# Patient Record
Sex: Female | Born: 1995 | Race: White | Marital: Single
Health system: Southern US, Community
[De-identification: ages and names within clinical notes are randomized; demographics above are authoritative.]

## PROBLEM LIST (undated history)

## (undated) DIAGNOSIS — K219 Gastro-esophageal reflux disease without esophagitis: Secondary | ICD-10-CM

## (undated) DIAGNOSIS — M419 Scoliosis, unspecified: Secondary | ICD-10-CM

## (undated) HISTORY — DX: Scoliosis, unspecified: M41.9

## (undated) HISTORY — DX: Gastro-esophageal reflux disease without esophagitis: K21.9

## (undated) HISTORY — PX: RHINOPLASTY: SUR1284

---
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
## 2014-05-30 ENCOUNTER — Ambulatory Visit (HOSPITAL_BASED_OUTPATIENT_CLINIC_OR_DEPARTMENT_OTHER)
Admission: RE | Admit: 2014-05-30 | Discharge: 2014-05-30 | Disposition: A | Discharge status: Home / Self Care | Payer: BC Managed Care – PPO | Source: Ambulatory Visit | Attending: Family Medicine | Admitting: Family Medicine

## 2014-05-30 ENCOUNTER — Other Ambulatory Visit: Payer: BC Managed Care – PPO

## 2014-05-30 ENCOUNTER — Encounter: Payer: Self-pay | Admitting: Family Medicine

## 2014-05-30 ENCOUNTER — Ambulatory Visit (INDEPENDENT_AMBULATORY_CARE_PROVIDER_SITE_OTHER): Payer: BC Managed Care – PPO | Admitting: Family Medicine

## 2014-05-30 VITALS — BP 106/70 | HR 68 | Resp 16 | Ht 64.5 in | Wt 114.5 lb

## 2014-05-30 DIAGNOSIS — R109 Unspecified abdominal pain: Secondary | ICD-10-CM | POA: Diagnosis present

## 2014-05-30 DIAGNOSIS — K8050 Calculus of bile duct without cholangitis or cholecystitis without obstruction: Secondary | ICD-10-CM | POA: Insufficient documentation

## 2014-05-30 DIAGNOSIS — R112 Nausea with vomiting, unspecified: Secondary | ICD-10-CM | POA: Insufficient documentation

## 2014-05-30 DIAGNOSIS — K8020 Calculus of gallbladder without cholecystitis without obstruction: Secondary | ICD-10-CM

## 2014-05-30 LAB — CBC WITH DIFFERENTIAL/PLATELET
Basophils Absolute: 0 10*3/uL (ref 0.0–0.1)
Basophils Relative: 0.5 % (ref 0.0–3.0)
Eosinophils Absolute: 0.1 10*3/uL (ref 0.0–0.7)
Eosinophils Relative: 2.1 % (ref 0.0–5.0)
HEMATOCRIT: 42 % (ref 36.0–49.0)
Hemoglobin: 14.2 g/dL (ref 12.0–16.0)
Lymphocytes Relative: 22.3 % — ABNORMAL LOW (ref 24.0–48.0)
Lymphs Abs: 1.4 10*3/uL (ref 0.7–4.0)
MCHC: 33.9 g/dL (ref 31.0–37.0)
MCV: 91.8 fl (ref 78.0–98.0)
MONO ABS: 0.5 10*3/uL (ref 0.1–1.0)
Monocytes Relative: 8.3 % (ref 3.0–12.0)
Neutro Abs: 4.2 10*3/uL (ref 1.4–7.7)
Neutrophils Relative %: 66.8 % (ref 43.0–71.0)
PLATELETS: 181 10*3/uL (ref 150.0–575.0)
RBC: 4.57 Mil/uL (ref 3.80–5.70)
RDW: 12.4 % (ref 11.4–15.5)
WBC: 6.2 10*3/uL (ref 4.5–13.5)

## 2014-05-30 LAB — AMYLASE: Amylase: 68 U/L (ref 27–131)

## 2014-05-30 LAB — BASIC METABOLIC PANEL
BUN: 12 mg/dL (ref 6–23)
CALCIUM: 9.8 mg/dL (ref 8.4–10.5)
CO2: 29 mEq/L (ref 19–32)
Chloride: 101 mEq/L (ref 96–112)
Creatinine, Ser: 0.8 mg/dL (ref 0.4–1.2)
GFR: 96.18 mL/min (ref >60.00)
Glucose, Bld: 80 mg/dL (ref 70–99)
Potassium: 3.8 mEq/L (ref 3.5–5.1)
SODIUM: 136 meq/L (ref 135–145)

## 2014-05-30 LAB — HEPATIC FUNCTION PANEL
ALBUMIN: 4.1 g/dL (ref 3.5–5.2)
ALK PHOS: 64 U/L (ref 47–119)
ALT: 21 U/L (ref 0–35)
AST: 27 U/L (ref 0–37)
Bilirubin, Direct: 0 mg/dL (ref 0.0–0.3)
Total Bilirubin: 0.6 mg/dL (ref 0.2–1.2)
Total Protein: 6.9 g/dL (ref 6.0–8.3)

## 2014-05-30 LAB — LIPID PANEL
Cholesterol: 109 mg/dL (ref 0–200)
HDL: 43.3 mg/dL (ref >39.00)
LDL Cholesterol: 56 mg/dL (ref 0–99)
NONHDL: 65.7
TRIGLYCERIDES: 48 mg/dL (ref 0.0–149.0)
Total CHOL/HDL Ratio: 3
VLDL: 9.6 mg/dL (ref 0.0–40.0)

## 2014-05-30 LAB — LIPASE: Lipase: 25 U/L (ref 11.0–59.0)

## 2014-05-30 NOTE — Progress Notes (Signed)
   Subjective:    Patient ID: Brianna Hampton, female    DOB: 1995-12-12, 18 y.o.   MRN: [id number]  HPI New to establish.  [facility]  Abd pain- pt reports 3 separate occurences where she will have 'doubled over pain', it will last ~8 hrs, she will vomit 'continuously'.  No diarrhea.  sxs resolve spontaneously.  No fevers.  Pt denies excessive ETOH, fatty foods.  [place]  Did not have imaging or lab work done.  Last episode was June 10th.  No pain inbetween episodes, able to eat regularly.  Denies GERD.  abd pain is epigastric.  Pain will radiate to R shoulder when it occurs.  [facility].   Review of Systems For ROS see HPI     Objective:   Physical Exam  Vitals reviewed. Constitutional: She is oriented to person, place, and time. She appears well-developed and well-nourished. No distress.  HENT:  Head: Normocephalic and atraumatic.  Eyes: Conjunctivae and EOM are normal. Pupils are equal, round, and reactive to light.  Neck: Normal range of motion. Neck supple. No thyromegaly present.  Cardiovascular: Normal rate, regular rhythm, normal heart sounds and intact distal pulses.   No murmur heard. Pulmonary/Chest: Effort normal and breath sounds normal. No respiratory distress.  Abdominal: Soft. Bowel sounds are normal. She exhibits no distension. There is no tenderness. There is no rebound and no guarding.  Musculoskeletal: She exhibits no edema.  Lymphadenopathy:    She has no cervical adenopathy.  Neurological: She is alert and oriented to person, place, and time.  Skin: Skin is warm and dry.  Psychiatric: She has a normal mood and affect. Her behavior is normal.          Assessment & Plan:

## 2014-05-30 NOTE — Progress Notes (Signed)
Pre visit review using our clinic review tool, if applicable. No additional management support is needed unless otherwise documented below in the visit note. 

## 2014-05-30 NOTE — Assessment & Plan Note (Signed)
New.  Pt has had 3 episodes of severe abdominal pain radiating to R shoulder w/ 'continuous vomiting'.  Pt mentioned that before the last episode, she had drank a milkshake.  Will get labs and US to assess.  Reviewed need for pt to eat low fat diet.  Will follow.

## 2014-05-30 NOTE — Patient Instructions (Signed)
Follow up as needed We'll notify you of your lab results and make any changes if needed Your US will be at 2:00 this afternoon at [facility] [address] Please watch high fat intake- this can trigger your abdominal symptoms Call with any questions or concerns Hang in there!!!

## 2014-06-02 ENCOUNTER — Encounter: Payer: Self-pay | Admitting: General Practice

## 2014-10-21 ENCOUNTER — Encounter: Payer: Self-pay | Admitting: Medical

## 2014-10-21 ENCOUNTER — Ambulatory Visit (INDEPENDENT_AMBULATORY_CARE_PROVIDER_SITE_OTHER): Payer: 59 | Admitting: Medical

## 2014-10-21 VITALS — BP 115/75 | HR 78 | Temp 97.2°F | Ht 64.5 in | Wt 114.2 lb

## 2014-10-21 DIAGNOSIS — J069 Acute upper respiratory infection, unspecified: Secondary | ICD-10-CM

## 2014-10-21 DIAGNOSIS — H109 Unspecified conjunctivitis: Secondary | ICD-10-CM

## 2014-10-21 MED ORDER — TOBRAMYCIN 0.3 % OP SOLN: 2.0000 [drp] | Freq: Four times a day (QID) | OPHTHALMIC | Status: DC

## 2014-10-21 MED ORDER — BENZONATATE 100 MG PO CAPS: 100.0000 mg | ORAL_CAPSULE | Freq: Three times a day (TID) | ORAL | Status: DC | PRN

## 2014-10-21 MED ORDER — FLUTICASONE PROPIONATE 50 MCG/ACT NA SUSP: 2.0000 | Freq: Every day | NASAL | Status: DC

## 2014-10-21 NOTE — Assessment & Plan Note (Signed)
Your appear to have bacterial conjunctivitis. I am prescribing tobrex eye drops. Any worsening or changing eye symptoms notify us.

## 2014-10-21 NOTE — Progress Notes (Signed)
Subjective:    Patient ID: Brianna Hampton, female    DOB: 09-26-1996, 19 y.o.   MRN: [id number]  HPI   Pt has rt  eye irritation for  3-4 days. No eye trauma or foreign body injury. Eye is mild irritated with redness. Slight matting daily in am x 3 days. Pt is not sure if exposure to person with conjunctivitis.(She lives in a dorm and may have had exposure).  No blurred vision. No rash or blister outbreak around eyes reported.  This am little crustiness to her left eye.   Since new years mild congestion, pnd, and runny nose with cough.  LMP- thanksgiving. Hx of irregular. No on ocp. Pt not sexually active.   Past Medical History  Diagnosis Date  . Scoliosis     History   Social History  . Marital Status: Single    Spouse Name: N/A    Number of Children: N/A  . Years of Education: N/A   Occupational History  . Not on file.   Social History Main Topics  . Smoking status: Never Smoker   . Smokeless tobacco: Not on file  . Alcohol Use: Yes  . Drug Use: No  . Sexual Activity: No   Other Topics Concern  . Not on file   Social History Narrative    Past Surgical History  Procedure Laterality Date  . Rhinoplasty      Family History  Problem Relation Age of Onset  . Cancer Paternal Aunt     breast and lung  . Arthritis Maternal Grandmother   . Heart disease Maternal Grandmother   . Arthritis Maternal Grandfather   . Arthritis Paternal Grandmother   . Arthritis Paternal Grandfather     No Known Allergies  No current outpatient prescriptions on file prior to visit.   No current facility-administered medications on file prior to visit.    BP 115/75 mmHg  Pulse 78  Temp(Src) 97.2 F (36.2 C) (Oral)  Ht 5' 4.5" (1.638 m)  Wt 114 lb 3.2 oz (51.801 kg)  BMI 19.31 kg/m2  SpO2 99%  LMP 09/11/2014      Review of Systems  Constitutional: Negative for fever, chills and fatigue.  HENT: Positive for congestion and postnasal drip. Negative for ear pain,  rhinorrhea, sinus pressure and sore throat.   Eyes:       Mild irritation redness and matting to both eyes.  Respiratory: Positive for cough.        Minimal transient occasnional recently. Since new years eve.  Cardiovascular: Negative for chest pain and palpitations.  Musculoskeletal: Negative for back pain.  Neurological: Negative for dizziness, weakness and headaches.  Hematological: Negative for adenopathy. Does not bruise/bleed easily.       Objective:   Physical Exam   General  Mental Status - Alert. General Appearance - Well groomed. Not in acute distress.  Skin Rashes- No Rashes.  HEENT Head- Normal. Ear Auditory Canal - Left- Normal. Right - Normal.Tympanic Membrane- Left- Normal. Right- Normal. Eye Sclera/Conjunctiva- Left- Normal presently. Right- faint red conjuctiva. Mild dry matting in rt lower eyelash Nose & Sinuses Nasal Mucosa- Left-  Mild boggy+  Congested. Right-  Mild  boggy + Congested. No sinus pressure. Mouth & Throat Lips: Upper Lip- Normal: no dryness, cracking, pallor, cyanosis, or vesicular eruption. Lower Lip-Normal: no dryness, cracking, pallor, cyanosis or vesicular eruption. Buccal Mucosa- Bilateral- No Aphthous ulcers. Oropharynx- No Discharge or Erythema. Tonsils: Characteristics- Bilateral- No Erythema or Congestion. Size/Enlargement- Bilateral- No enlargement.  Discharge- bilateral-None.  Neck Neck- Supple. No Masses.   Chest and Lung Exam Auscultation: Breath Sounds:- even and unlabored,   Cardiovascular Auscultation:Rythm- Regular, rate and rhythm. Murmurs & Other Heart Sounds:Ausculatation of the heart reveal- No Murmurs.  Lymphatic Head & Neck General Head & Neck Lymphatics: Bilateral: Description- No Localized lymphadenopathy.         Assessment & Plan:

## 2014-10-21 NOTE — Assessment & Plan Note (Signed)
For your recent uri type symptoms, I am prescribing flonase for the nasal congestion and benzonatate for cough. If your symptoms change or worsen notify us

## 2014-10-21 NOTE — Patient Instructions (Addendum)
Your appear to have bacterial conjunctivitis. I am prescribing tobrex eye drops. Any worsening or changing eye symptoms notify us.  For your recent uri type symptoms, I am prescribing flonase for the nasal congestion and benzonatate for cough. If your symptoms change or worsen notify us.  Follow up in 7 days or as needed.

## 2014-10-21 NOTE — Progress Notes (Signed)
Pre visit review using our clinic review tool, if applicable. No additional management support is needed unless otherwise documented below in the visit note. 

## 2015-01-03 IMAGING — US US ABDOMEN COMPLETE
1 series · 14 of 25 positions shown · non-contrast
Comparison: None.

CLINICAL DATA: Intermittent abdominal pain for 9 months with nausea
and vomiting.

EXAM:
ULTRASOUND ABDOMEN COMPLETE

[Series 1: us abdomen complete · 0.22mm/px · 14 of 85 slices shown]
[im 1/85]
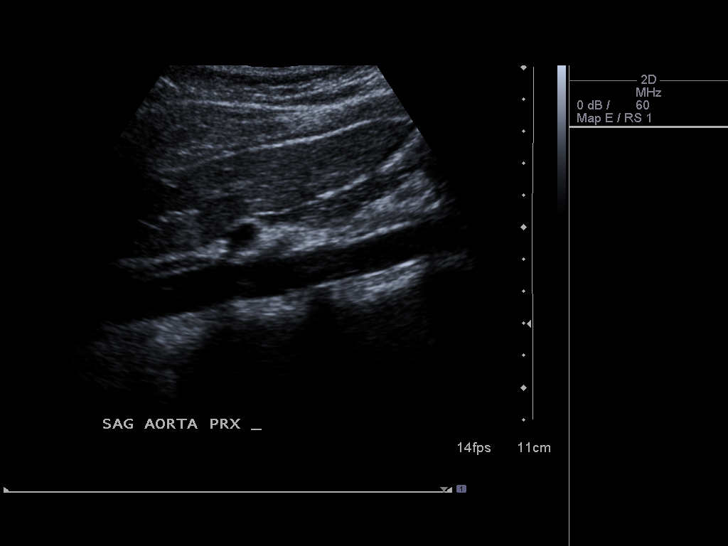
[im 8/85]
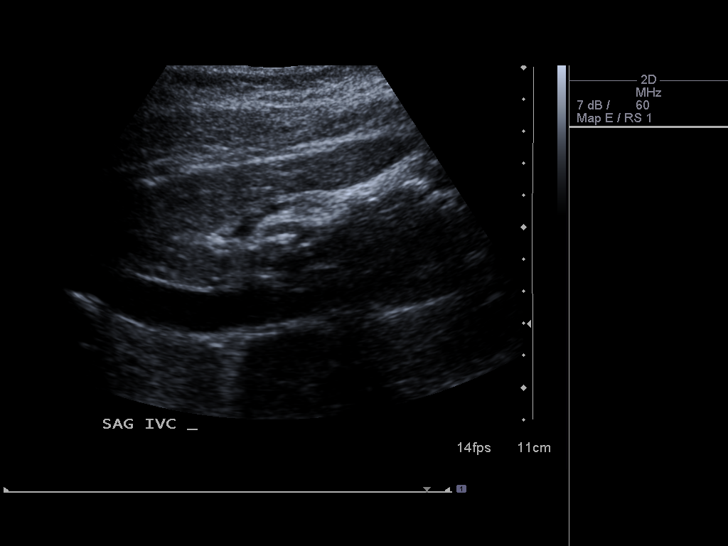
[im 15/85]
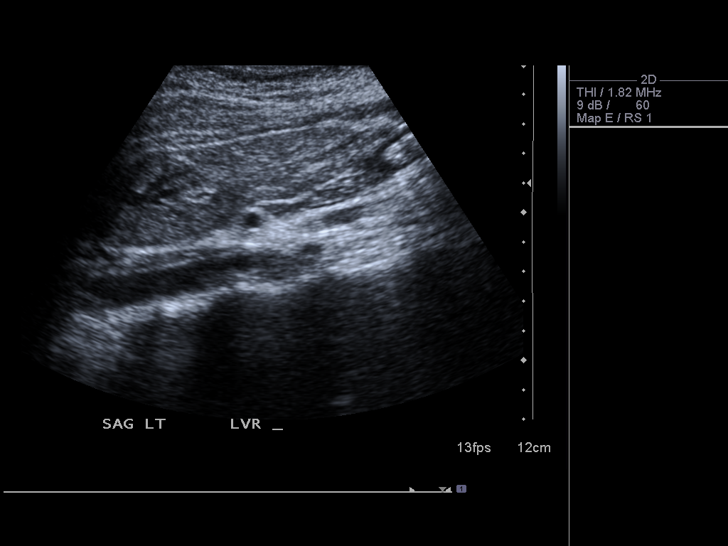
[im 22/85]
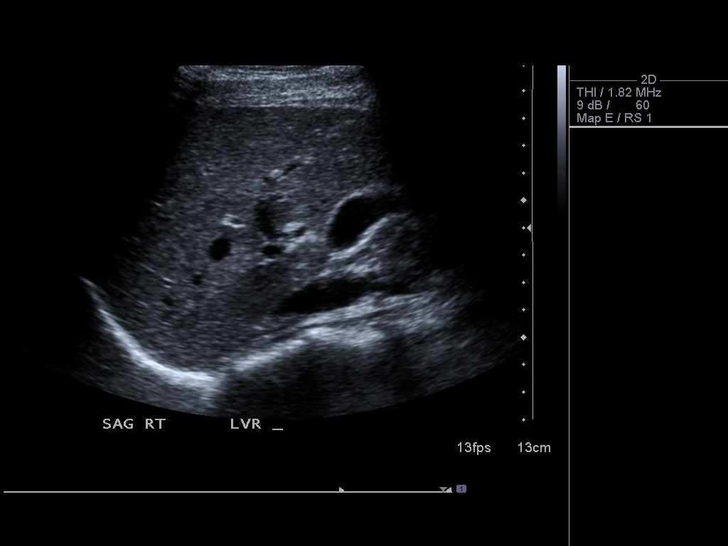
[im 29/85]
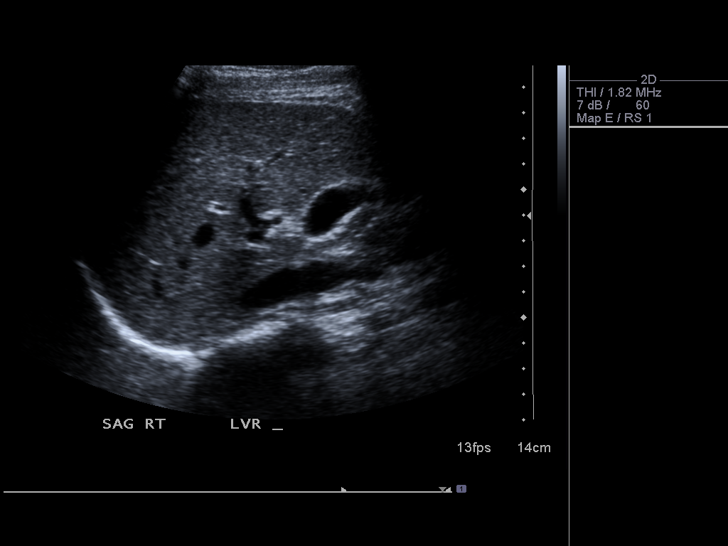
[im 32/85]
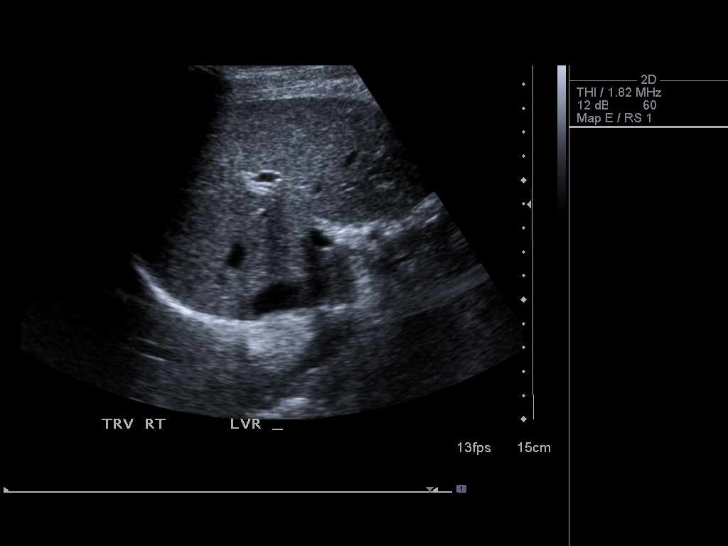
[im 39/85]
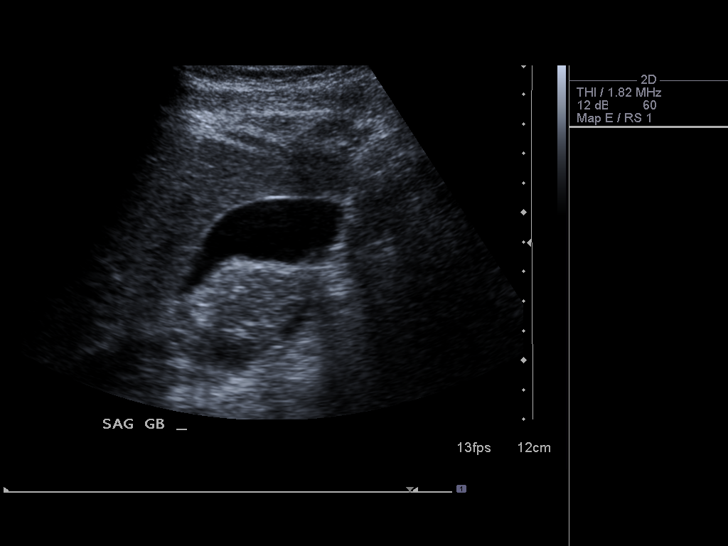
[im 46/85]
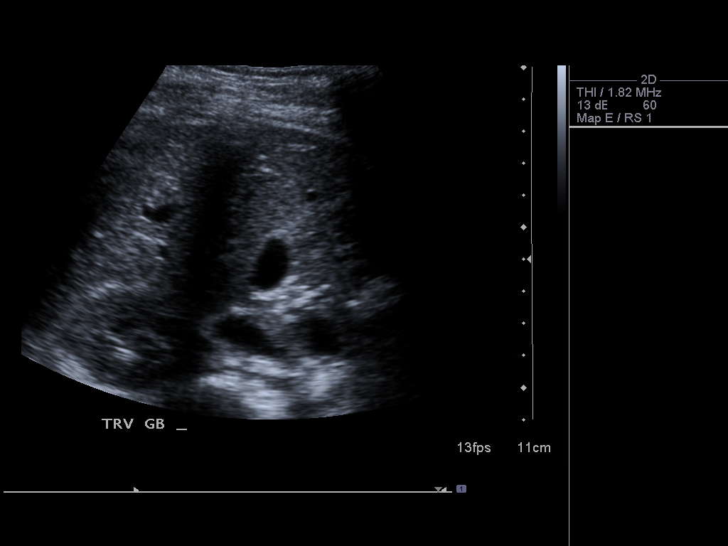
[im 53/85]
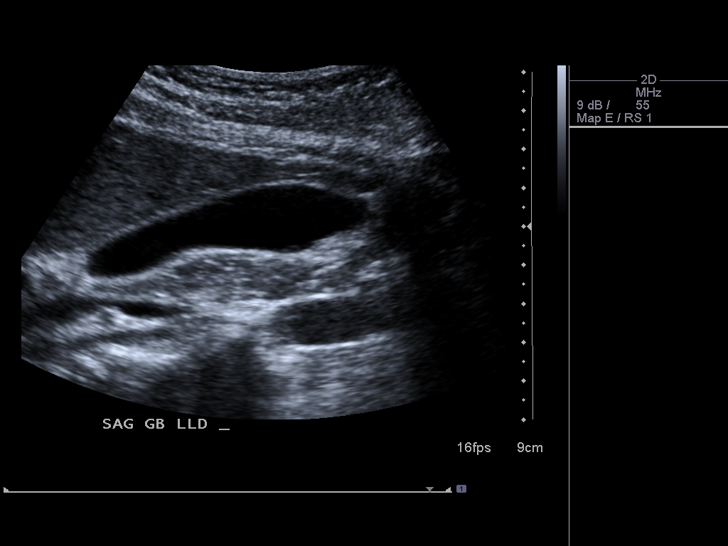
[im 57/85]
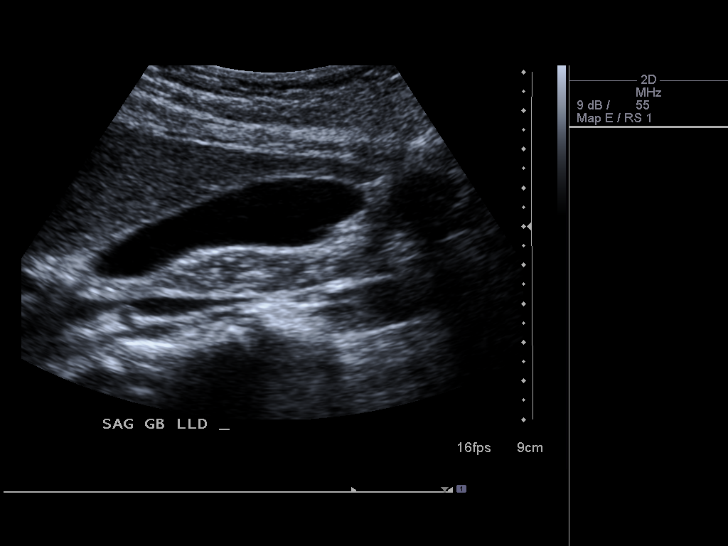
[im 64/85]
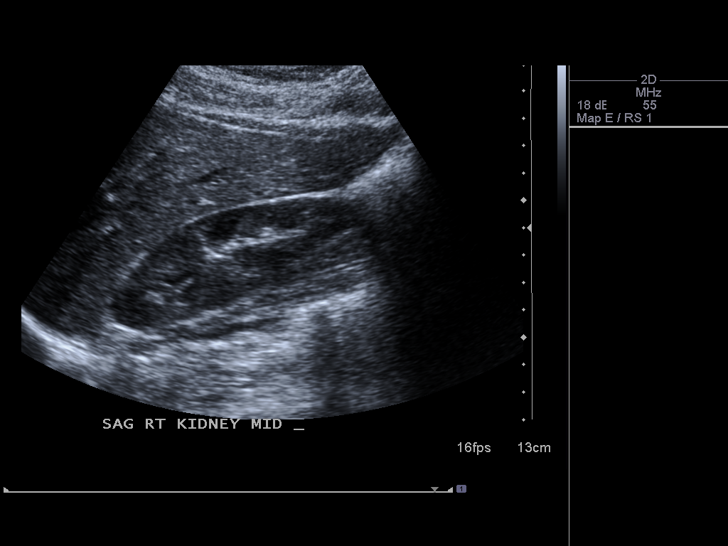
[im 71/85]
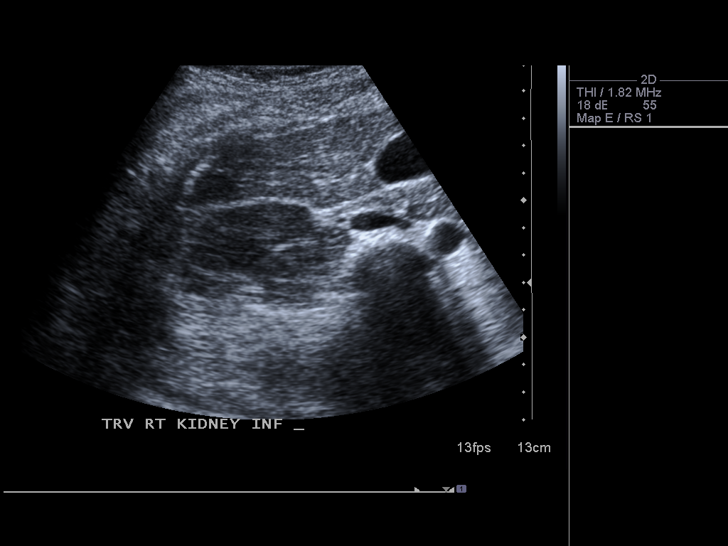
[im 78/85]
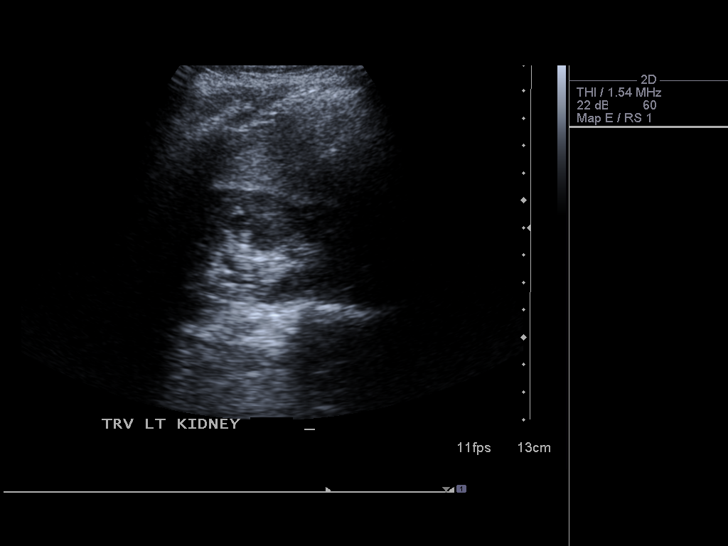
[im 85/85]
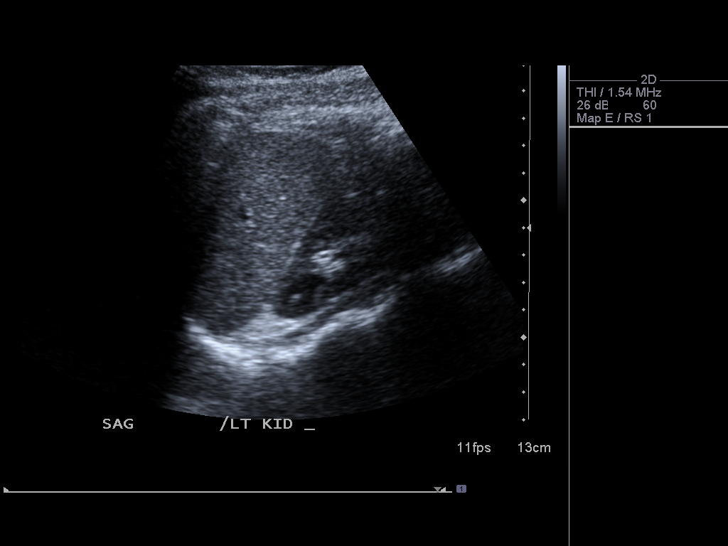

[14 of 25 positions shown; findings below may reference images not displayed]

FINDINGS: Gallbladder:

No gallstones or wall thickening visualized. No sonographic Murphy
sign noted.

Common bile duct:

Diameter: 0.9 cm

Liver:

No focal lesion identified. Within normal limits in parenchymal
echogenicity.

IVC:

No abnormality visualized.

Pancreas:

Visualized portion unremarkable.

Spleen:

Size and appearance within normal limits.

Right Kidney:

Length: 10.5 cm. Echogenicity within normal limits. No mass or
hydronephrosis visualized.

Left Kidney:

Length: 10.5 cm. Echogenicity within normal limits. No mass or
hydronephrosis visualized.

Abdominal aorta:

No aneurysm visualized.

Other findings:

None.
IMPRESSION: Negative for gallstones.  Normal examination.

## 2015-03-19 ENCOUNTER — Telehealth: Payer: Self-pay | Admitting: *Deleted

## 2015-03-19 ENCOUNTER — Encounter: Payer: Self-pay | Admitting: *Deleted

## 2015-03-19 NOTE — Telephone Encounter (Signed)
Pre-Visit Call completed with patient and chart updated.   Pre-Visit Info documented in Specialty Comments under [employer].    

## 2015-03-23 ENCOUNTER — Ambulatory Visit (INDEPENDENT_AMBULATORY_CARE_PROVIDER_SITE_OTHER): Payer: 59 | Admitting: Physician Assistant

## 2015-03-23 ENCOUNTER — Encounter: Payer: Self-pay | Admitting: Physician Assistant

## 2015-03-23 VITALS — BP 94/56 | HR 47 | Temp 97.6°F | Ht 64.5 in | Wt 108.0 lb

## 2015-03-23 DIAGNOSIS — Z114 Encounter for screening for human immunodeficiency virus [HIV]: Secondary | ICD-10-CM | POA: Diagnosis not present

## 2015-03-23 DIAGNOSIS — Z0000 Encounter for general adult medical examination without abnormal findings: Secondary | ICD-10-CM

## 2015-03-23 DIAGNOSIS — R21 Rash and other nonspecific skin eruption: Secondary | ICD-10-CM | POA: Insufficient documentation

## 2015-03-23 LAB — LIPID PANEL
Cholesterol: 133 mg/dL (ref 0–200)
HDL: 46.6 mg/dL (ref >39.00)
LDL Cholesterol: 75 mg/dL (ref 0–99)
NonHDL: 86.4
TRIGLYCERIDES: 59 mg/dL (ref 0.0–149.0)
Total CHOL/HDL Ratio: 3
VLDL: 11.8 mg/dL (ref 0.0–40.0)

## 2015-03-23 LAB — HEPATIC FUNCTION PANEL
ALK PHOS: 88 U/L (ref 39–117)
ALT: 17 U/L (ref 0–35)
AST: 27 U/L (ref 0–37)
Albumin: 4.4 g/dL (ref 3.5–5.2)
BILIRUBIN DIRECT: 0.1 mg/dL (ref 0.0–0.3)
Total Bilirubin: 0.4 mg/dL (ref 0.2–1.2)
Total Protein: 7.5 g/dL (ref 6.0–8.3)

## 2015-03-23 LAB — BASIC METABOLIC PANEL
BUN: 13 mg/dL (ref 6–23)
CALCIUM: 9.7 mg/dL (ref 8.4–10.5)
CHLORIDE: 104 meq/L (ref 96–112)
CO2: 25 mEq/L (ref 19–32)
Creatinine, Ser: 0.84 mg/dL (ref 0.40–1.20)
GFR: 91.47 mL/min (ref >60.00)
GLUCOSE: 87 mg/dL (ref 70–99)
Potassium: 4.2 mEq/L (ref 3.5–5.1)
Sodium: 138 mEq/L (ref 135–145)

## 2015-03-23 LAB — CBC
HCT: 38.3 % (ref 36.0–46.0)
Hemoglobin: 12.6 g/dL (ref 12.0–15.0)
MCHC: 32.9 g/dL (ref 30.0–36.0)
MCV: 89.4 fl (ref 78.0–100.0)
Platelets: 239 10*3/uL (ref 150.0–400.0)
RBC: 4.29 Mil/uL (ref 3.87–5.11)
RDW: 13 % (ref 11.5–14.6)
WBC: 6.2 10*3/uL (ref 4.5–10.5)

## 2015-03-23 LAB — SEDIMENTATION RATE: Sed Rate: 19 mm/hr (ref 0–22)

## 2015-03-23 LAB — TSH: TSH: 1.43 u[IU]/mL (ref 0.35–5.50)

## 2015-03-23 NOTE — Assessment & Plan Note (Signed)
No risk factors. Will check HIV ab.

## 2015-03-23 NOTE — Assessment & Plan Note (Signed)
Asymptomatic at present.  Associated with cold intolerance.  Will check CBC, TSH, ESR today.  If all normal will refer to Allergy for assessment.  Supportive measures reviewed.

## 2015-03-23 NOTE — Progress Notes (Signed)
Patient presents to clinic today for annual exam.  Patient is fasting for labs.  Acute Concerns: Patient c/o cold intolerance first noted in February. Denies weight gain. Denies constipation. Denies dry skin. Denies family history of hypothyroidism.  Denies history anemia, denies palpitations or SOB.  Denies heavy menstrual periods.  Is sometimes getting an itchy rash when this occurs. Denies change soaps, lotions or detergents. Denies new pet.  Health Maintenance: Dental -- up-to-date Vision -- up-to-date Immunizations -- up-to-date  Past Medical History  Diagnosis Date  . Scoliosis     Past Surgical History  Procedure Laterality Date  . Rhinoplasty      No current outpatient prescriptions on file prior to visit.   No current facility-administered medications on file prior to visit.    No Known Allergies  Family History  Problem Relation Age of Onset  . Cancer Paternal Aunt     breast and lung  . Arthritis Maternal Grandmother   . Heart disease Maternal Grandmother   . Arthritis Maternal Grandfather   . Arthritis Paternal Grandmother   . Arthritis Paternal Grandfather     History   Social History  . Marital Status: Single    Spouse Name: N/A  . Number of Children: N/A  . Years of Education: N/A   Occupational History  . Student    Social History Main Topics  . Smoking status: Never Smoker   . Smokeless tobacco: Never Used  . Alcohol Use: 0.0 oz/week    0 Standard drinks or equivalent per week     Comment: rare  . Drug Use: No  . Sexual Activity: Not Currently   Other Topics Concern  . Not on file   Social History Narrative   Review of Systems  Constitutional: Negative for fever and weight loss.  HENT: Negative for ear discharge, ear pain, hearing loss and tinnitus.   Eyes: Negative for blurred vision, double vision, photophobia and pain.  Respiratory: Negative for cough and shortness of breath.   Cardiovascular: Negative for chest pain and  palpitations.  Gastrointestinal: Negative for heartburn, nausea, vomiting, abdominal pain, diarrhea, constipation, blood in stool and melena.  Genitourinary: Negative for dysuria, urgency, frequency, hematuria and flank pain.  Musculoskeletal: Negative for falls.  Skin: Positive for itching and rash.  Neurological: Negative for dizziness, loss of consciousness and headaches.  Endo/Heme/Allergies: Negative for environmental allergies.  Psychiatric/Behavioral: Negative for depression, suicidal ideas, hallucinations and substance abuse. The patient is not nervous/anxious and does not have insomnia.     BP 94/56 mmHg  Pulse 47  Temp(Src) 97.6 F (36.4 C) (Oral)  Ht 5' 4.5" (1.638 m)  Wt 108 lb (48.988 kg)  BMI 18.26 kg/m2  SpO2 100%  LMP 10/05/2014  Physical Exam  Constitutional: She is oriented to person, place, and time and well-developed, well-nourished, and in no distress.  HENT:  Head: Normocephalic and atraumatic.  Right Ear: Tympanic membrane, external ear and ear canal normal.  Left Ear: Tympanic membrane, external ear and ear canal normal.  Nose: Nose normal. No mucosal edema.  Mouth/Throat: Uvula is midline, oropharynx is clear and moist and mucous membranes are normal. No oropharyngeal exudate or posterior oropharyngeal erythema.  Eyes: Conjunctivae are normal. Pupils are equal, round, and reactive to light.  Neck: Neck supple. No thyromegaly present.  Cardiovascular: Normal rate, regular rhythm, normal heart sounds and intact distal pulses.   Pulmonary/Chest: Effort normal and breath sounds normal. No respiratory distress. She has no wheezes. She has no rales.  Abdominal: Soft. Bowel sounds are normal. She exhibits no distension and no mass. There is no tenderness. There is no rebound and no guarding.  Lymphadenopathy:    She has no cervical adenopathy.  Neurological: She is alert and oriented to person, place, and time. No cranial nerve deficit.  Skin: Skin is warm and  dry. No rash noted.  Psychiatric: Affect normal.  Vitals reviewed.  Assessment/Plan: Visit for preventive health examination Depression screen negative.  Health Maintenance reviewed -- patient up-to-date on TDaP.  Is not due for PAP until age 32. Patient agrees to one-time screening for HIV.  Patient without risk factors. Will obtain fasting labs today to include CBC, BMP, LFt, Lipid Panel and UA.    Screening for HIV without presence of risk factors No risk factors. Will check HIV ab.   Rash and nonspecific skin eruption Asymptomatic at present.  Associated with cold intolerance.  Will check CBC, TSH, ESR today.  If all normal will refer to Allergy for assessment.  Supportive measures reviewed.

## 2015-03-23 NOTE — Assessment & Plan Note (Signed)
Depression screen negative.  Health Maintenance reviewed -- patient up-to-date on TDaP.  Is not due for PAP until age 89. Patient agrees to one-time screening for HIV.  Patient without risk factors. Will obtain fasting labs today to include CBC, BMP, LFt, Lipid Panel and UA.

## 2015-03-23 NOTE — Progress Notes (Signed)
Pre visit review using our clinic review tool, if applicable. No additional management support is needed unless otherwise documented below in the visit note. 

## 2015-03-23 NOTE — Patient Instructions (Signed)
Please go to the lab for blood work.  I will call you with your results. If all looks good, will refer you to Allergy for further assessment of this unexplainable rash. If anything is abnormal, we will treat accordingly.  Preventive Care for Adults A healthy lifestyle and preventive care can promote health and wellness. Preventive health guidelines for women include the following key practices.  A routine yearly physical is a good way to check with your health care provider about your health and preventive screening. It is a chance to share any concerns and updates on your health and to receive a thorough exam.  Visit your dentist for a routine exam and preventive care every 6 months. Brush your teeth twice a day and floss once a day. Good oral hygiene prevents tooth decay and gum disease.  The frequency of eye exams is based on your age, health, family medical history, use of contact lenses, and other factors. Follow your health care provider's recommendations for frequency of eye exams.  Eat a healthy diet. Foods like vegetables, fruits, whole grains, low-fat dairy products, and lean protein foods contain the nutrients you need without too many calories. Decrease your intake of foods high in solid fats, added sugars, and salt. Eat the right amount of calories for you.Get information about a proper diet from your health care provider, if necessary.  Regular physical exercise is one of the most important things you can do for your health. Most adults should get at least 150 minutes of moderate-intensity exercise (any activity that increases your heart rate and causes you to sweat) each week. In addition, most adults need muscle-strengthening exercises on 2 or more days a week.  Maintain a healthy weight. The body mass index (BMI) is a screening tool to identify possible weight problems. It provides an estimate of body fat based on height and weight. Your health care provider can find your BMI and  can help you achieve or maintain a healthy weight.For adults 20 years and older:  A BMI below 18.5 is considered underweight.  A BMI of 18.5 to 24.9 is normal.  A BMI of 25 to 29.9 is considered overweight.  A BMI of 30 and above is considered obese.  Maintain normal blood lipids and cholesterol levels by exercising and minimizing your intake of saturated fat. Eat a balanced diet with plenty of fruit and vegetables. Blood tests for lipids and cholesterol should begin at age 36 and be repeated every 5 years. If your lipid or cholesterol levels are high, you are over 50, or you are at high risk for heart disease, you may need your cholesterol levels checked more frequently.Ongoing high lipid and cholesterol levels should be treated with medicines if diet and exercise are not working.  If you smoke, find out from your health care provider how to quit. If you do not use tobacco, do not start.  Lung cancer screening is recommended for adults aged 69-80 years who are at high risk for developing lung cancer because of a history of smoking. A yearly low-dose CT scan of the lungs is recommended for people who have at least a 30-pack-year history of smoking and are a current smoker or have quit within the past 15 years. A pack year of smoking is smoking an average of 1 pack of cigarettes a day for 1 year (for example: 1 pack a day for 30 years or 2 packs a day for 15 years). Yearly screening should continue until the smoker has  stopped smoking for at least 15 years. Yearly screening should be stopped for people who develop a health problem that would prevent them from having lung cancer treatment.  If you are pregnant, do not drink alcohol. If you are breastfeeding, be very cautious about drinking alcohol. If you are not pregnant and choose to drink alcohol, do not have more than 1 drink per day. One drink is considered to be 12 ounces (355 mL) of beer, 5 ounces (148 mL) of wine, or 1.5 ounces (44 mL) of  liquor.  Avoid use of street drugs. Do not share needles with anyone. Ask for help if you need support or instructions about stopping the use of drugs.  High blood pressure causes heart disease and increases the risk of stroke. Your blood pressure should be checked at least every 1 to 2 years. Ongoing high blood pressure should be treated with medicines if weight loss and exercise do not work.  If you are 52-31 years old, ask your health care provider if you should take aspirin to prevent strokes.  Diabetes screening involves taking a blood sample to check your fasting blood sugar level. This should be done once every 3 years, after age 65, if you are within normal weight and without risk factors for diabetes. Testing should be considered at a younger age or be carried out more frequently if you are overweight and have at least 1 risk factor for diabetes.  Breast cancer screening is essential preventive care for women. You should practice "breast self-awareness." This means understanding the normal appearance and feel of your breasts and may include breast self-examination. Any changes detected, no matter how small, should be reported to a health care provider. Women in their 1s and 30s should have a clinical breast exam (CBE) by a health care provider as part of a regular health exam every 1 to 3 years. After age 39, women should have a CBE every year. Starting at age 20, women should consider having a mammogram (breast X-ray test) every year. Women who have a family history of breast cancer should talk to their health care provider about genetic screening. Women at a high risk of breast cancer should talk to their health care providers about having an MRI and a mammogram every year.  Breast cancer gene (BRCA)-related cancer risk assessment is recommended for women who have family members with BRCA-related cancers. BRCA-related cancers include breast, ovarian, tubal, and peritoneal cancers. Having  family members with these cancers may be associated with an increased risk for harmful changes (mutations) in the breast cancer genes BRCA1 and BRCA2. Results of the assessment will determine the need for genetic counseling and BRCA1 and BRCA2 testing.  Routine pelvic exams to screen for cancer are no longer recommended for nonpregnant women who are considered low risk for cancer of the pelvic organs (ovaries, uterus, and vagina) and who do not have symptoms. Ask your health care provider if a screening pelvic exam is right for you.  If you have had past treatment for cervical cancer or a condition that could lead to cancer, you need Pap tests and screening for cancer for at least 20 years after your treatment. If Pap tests have been discontinued, your risk factors (such as having a new sexual partner) need to be reassessed to determine if screening should be resumed. Some women have medical problems that increase the chance of getting cervical cancer. In these cases, your health care provider may recommend more frequent screening and Pap tests.  The HPV test is an additional test that may be used for cervical cancer screening. The HPV test looks for the virus that can cause the cell changes on the cervix. The cells collected during the Pap test can be tested for HPV. The HPV test could be used to screen women aged 71 years and older, and should be used in women of any age who have unclear Pap test results. After the age of 62, women should have HPV testing at the same frequency as a Pap test.  Colorectal cancer can be detected and often prevented. Most routine colorectal cancer screening begins at the age of 4 years and continues through age 6 years. However, your health care provider may recommend screening at an earlier age if you have risk factors for colon cancer. On a yearly basis, your health care provider may provide home test kits to check for hidden blood in the stool. Use of a small camera at  the end of a tube, to directly examine the colon (sigmoidoscopy or colonoscopy), can detect the earliest forms of colorectal cancer. Talk to your health care provider about this at age 32, when routine screening begins. Direct exam of the colon should be repeated every 5-10 years through age 21 years, unless early forms of pre-cancerous polyps or small growths are found.  People who are at an increased risk for hepatitis B should be screened for this virus. You are considered at high risk for hepatitis B if:  You were born in a country where hepatitis B occurs often. Talk with your health care provider about which countries are considered high risk.  Your parents were born in a high-risk country and you have not received a shot to protect against hepatitis B (hepatitis B vaccine).  You have HIV or AIDS.  You use needles to inject street drugs.  You live with, or have sex with, someone who has hepatitis B.  You get hemodialysis treatment.  You take certain medicines for conditions like cancer, organ transplantation, and autoimmune conditions.  Hepatitis C blood testing is recommended for all people born from [birth year] through 1965 and any individual with known risks for hepatitis C.  Practice safe sex. Use condoms and avoid high-risk sexual practices to reduce the spread of sexually transmitted infections (STIs). STIs include gonorrhea, chlamydia, syphilis, trichomonas, herpes, HPV, and human immunodeficiency virus (HIV). Herpes, HIV, and HPV are viral illnesses that have no cure. They can result in disability, cancer, and death.  You should be screened for sexually transmitted illnesses (STIs) including gonorrhea and chlamydia if:  You are sexually active and are younger than 24 years.  You are older than 24 years and your health care provider tells you that you are at risk for this type of infection.  Your sexual activity has changed since you were last screened and you are at an increased  risk for chlamydia or gonorrhea. Ask your health care provider if you are at risk.  If you are at risk of being infected with HIV, it is recommended that you take a prescription medicine daily to prevent HIV infection. This is called preexposure prophylaxis (PrEP). You are considered at risk if:  You are a heterosexual woman, are sexually active, and are at increased risk for HIV infection.  You take drugs by injection.  You are sexually active with a partner who has HIV.  Talk with your health care provider about whether you are at high risk of being infected with HIV. If you  choose to begin PrEP, you should first be tested for HIV. You should then be tested every 3 months for as long as you are taking PrEP.  Osteoporosis is a disease in which the bones lose minerals and strength with aging. This can result in serious bone fractures or breaks. The risk of osteoporosis can be identified using a bone density scan. Women ages 48 years and over and women at risk for fractures or osteoporosis should discuss screening with their health care providers. Ask your health care provider whether you should take a calcium supplement or vitamin D to reduce the rate of osteoporosis.  Menopause can be associated with physical symptoms and risks. Hormone replacement therapy is available to decrease symptoms and risks. You should talk to your health care provider about whether hormone replacement therapy is right for you.  Use sunscreen. Apply sunscreen liberally and repeatedly throughout the day. You should seek shade when your shadow is shorter than you. Protect yourself by wearing long sleeves, pants, a wide-brimmed hat, and sunglasses year round, whenever you are outdoors.  Once a month, do a whole body skin exam, using a mirror to look at the skin on your back. Tell your health care provider of new moles, moles that have irregular borders, moles that are larger than a pencil eraser, or moles that have changed  in shape or color.  Stay current with required vaccines (immunizations).  Influenza vaccine. All adults should be immunized every year.  Tetanus, diphtheria, and acellular pertussis (Td, Tdap) vaccine. Pregnant women should receive 1 dose of Tdap vaccine during each pregnancy. The dose should be obtained regardless of the length of time since the last dose. Immunization is preferred during the 27th-36th week of gestation. An adult who has not previously received Tdap or who does not know her vaccine status should receive 1 dose of Tdap. This initial dose should be followed by tetanus and diphtheria toxoids (Td) booster doses every 10 years. Adults with an unknown or incomplete history of completing a 3-dose immunization series with Td-containing vaccines should begin or complete a primary immunization series including a Tdap dose. Adults should receive a Td booster every 10 years.  Varicella vaccine. An adult without evidence of immunity to varicella should receive 2 doses or a second dose if she has previously received 1 dose. Pregnant females who do not have evidence of immunity should receive the first dose after pregnancy. This first dose should be obtained before leaving the health care facility. The second dose should be obtained 4-8 weeks after the first dose.  Human papillomavirus (HPV) vaccine. Females aged 13-26 years who have not received the vaccine previously should obtain the 3-dose series. The vaccine is not recommended for use in pregnant females. However, pregnancy testing is not needed before receiving a dose. If a female is found to be pregnant after receiving a dose, no treatment is needed. In that [place], the remaining doses should be delayed until after the pregnancy. Immunization is recommended for any person with an immunocompromised condition through the age of 25 years if she did not get any or all doses earlier. During the 3-dose series, the second dose should be obtained 4-8 weeks  after the first dose. The third dose should be obtained 24 weeks after the first dose and 16 weeks after the second dose.  Zoster vaccine. One dose is recommended for adults aged 76 years or older unless certain conditions are present.  Measles, mumps, and rubella (MMR) vaccine. Adults born before  1959 generally are considered immune to measles and mumps. Adults born in [birth year] or later should have 1 or more doses of MMR vaccine unless there is a contraindication to the vaccine or there is laboratory evidence of immunity to each of the three diseases. A routine second dose of MMR vaccine should be obtained at least 28 days after the first dose for students attending postsecondary schools, health care workers, or international travelers. People who received inactivated measles vaccine or an unknown type of measles vaccine during 1963-1967 should receive 2 doses of MMR vaccine. People who received inactivated mumps vaccine or an unknown type of mumps vaccine before 1979 and are at high risk for mumps infection should consider immunization with 2 doses of MMR vaccine. For females of childbearing age, rubella immunity should be determined. If there is no evidence of immunity, females who are not pregnant should be vaccinated. If there is no evidence of immunity, females who are pregnant should delay immunization until after pregnancy. Unvaccinated health care workers born before [birth year] who lack laboratory evidence of measles, mumps, or rubella immunity or laboratory confirmation of disease should consider measles and mumps immunization with 2 doses of MMR vaccine or rubella immunization with 1 dose of MMR vaccine.  Pneumococcal 13-valent conjugate (PCV13) vaccine. When indicated, a person who is uncertain of her immunization history and has no record of immunization should receive the PCV13 vaccine. An adult aged 33 years or older who has certain medical conditions and has not been previously immunized should receive 1  dose of PCV13 vaccine. This PCV13 should be followed with a dose of pneumococcal polysaccharide (PPSV23) vaccine. The PPSV23 vaccine dose should be obtained at least 8 weeks after the dose of PCV13 vaccine. An adult aged 26 years or older who has certain medical conditions and previously received 1 or more doses of PPSV23 vaccine should receive 1 dose of PCV13. The PCV13 vaccine dose should be obtained 1 or more years after the last PPSV23 vaccine dose.  Pneumococcal polysaccharide (PPSV23) vaccine. When PCV13 is also indicated, PCV13 should be obtained first. All adults aged 91 years and older should be immunized. An adult younger than age 51 years who has certain medical conditions should be immunized. Any person who resides in a nursing home or long-term care facility should be immunized. An adult smoker should be immunized. People with an immunocompromised condition and certain other conditions should receive both PCV13 and PPSV23 vaccines. People with human immunodeficiency virus (HIV) infection should be immunized as soon as possible after diagnosis. Immunization during chemotherapy or radiation therapy should be avoided. Routine use of PPSV23 vaccine is not recommended for American Indians, [place] Natives, or people younger than 65 years unless there are medical conditions that require PPSV23 vaccine. When indicated, people who have unknown immunization and have no record of immunization should receive PPSV23 vaccine. One-time revaccination 5 years after the first dose of PPSV23 is recommended for people aged 19-64 years who have chronic kidney failure, nephrotic syndrome, asplenia, or immunocompromised conditions. People who received 1-2 doses of PPSV23 before age 38 years should receive another dose of PPSV23 vaccine at age 47 years or later if at least 5 years have passed since the previous dose. Doses of PPSV23 are not needed for people immunized with PPSV23 at or after age 75 years.  Meningococcal  vaccine. Adults with asplenia or persistent complement component deficiencies should receive 2 doses of quadrivalent meningococcal conjugate (MenACWY-D) vaccine. The doses should be obtained at least 2 months  apart. Microbiologists working with certain meningococcal bacteria, [employer] recruits, people at risk during an outbreak, and people who travel to or live in countries with a high rate of meningitis should be immunized. A first-year college student up through age 13 years who is living in a residence hall should receive a dose if she did not receive a dose on or after her 16th birthday. Adults who have certain high-risk conditions should receive one or more doses of vaccine.  Hepatitis A vaccine. Adults who wish to be protected from this disease, have certain high-risk conditions, work with hepatitis A-infected animals, work in hepatitis A research labs, or travel to or work in countries with a high rate of hepatitis A should be immunized. Adults who were previously unvaccinated and who anticipate close contact with an international adoptee during the first 60 days after arrival in the [place] from a country with a high rate of hepatitis A should be immunized.  Hepatitis B vaccine. Adults who wish to be protected from this disease, have certain high-risk conditions, may be exposed to blood or other infectious body fluids, are household contacts or sex partners of hepatitis B positive people, are clients or workers in certain care facilities, or travel to or work in countries with a high rate of hepatitis B should be immunized.  Haemophilus influenzae type b (Hib) vaccine. A previously unvaccinated person with asplenia or sickle cell disease or having a scheduled splenectomy should receive 1 dose of Hib vaccine. Regardless of previous immunization, a recipient of a hematopoietic stem cell transplant should receive a 3-dose series 6-12 months after her successful transplant. Hib vaccine is not  recommended for adults with HIV infection. Preventive Services / Frequency Ages 45 to 42 years  Blood pressure check.** / Every 1 to 2 years.  Lipid and cholesterol check.** / Every 5 years beginning at age 54.  Clinical breast exam.** / Every 3 years for women in their 22s and 106s.  BRCA-related cancer risk assessment.** / For women who have family members with a BRCA-related cancer (breast, ovarian, tubal, or peritoneal cancers).  Pap test.** / Every 2 years from ages 55 through 21. Every 3 years starting at age 39 through age 33 or 19 with a history of 3 consecutive normal Pap tests.  HPV screening.** / Every 3 years from ages 75 through ages 63 to 72 with a history of 3 consecutive normal Pap tests.  Hepatitis C blood test.** / For any individual with known risks for hepatitis C.  Skin self-exam. / Monthly.  Influenza vaccine. / Every year.  Tetanus, diphtheria, and acellular pertussis (Tdap, Td) vaccine.** / Consult your health care provider. Pregnant women should receive 1 dose of Tdap vaccine during each pregnancy. 1 dose of Td every 10 years.  Varicella vaccine.** / Consult your health care provider. Pregnant females who do not have evidence of immunity should receive the first dose after pregnancy.  HPV vaccine. / 3 doses over 6 months, if 34 and younger. The vaccine is not recommended for use in pregnant females. However, pregnancy testing is not needed before receiving a dose.  Measles, mumps, rubella (MMR) vaccine.** / You need at least 1 dose of MMR if you were born in 1957 or later. You may also need a 2nd dose. For females of childbearing age, rubella immunity should be determined. If there is no evidence of immunity, females who are not pregnant should be vaccinated. If there is no evidence of immunity, females who are pregnant should  delay immunization until after pregnancy.  Pneumococcal 13-valent conjugate (PCV13) vaccine.** / Consult your health care  provider.  Pneumococcal polysaccharide (PPSV23) vaccine.** / 1 to 2 doses if you smoke cigarettes or if you have certain conditions.  Meningococcal vaccine.** / 1 dose if you are age 82 to 73 years and a Market researcher living in a residence hall, or have one of several medical conditions, you need to get vaccinated against meningococcal disease. You may also need additional booster doses.  Hepatitis A vaccine.** / Consult your health care provider.  Hepatitis B vaccine.** / Consult your health care provider.  Haemophilus influenzae type b (Hib) vaccine.** / Consult your health care provider. Ages 87 to 29 years  Blood pressure check.** / Every 1 to 2 years.  Lipid and cholesterol check.** / Every 5 years beginning at age 67 years.  Lung cancer screening. / Every year if you are aged 26-80 years and have a 30-pack-year history of smoking and currently smoke or have quit within the past 15 years. Yearly screening is stopped once you have quit smoking for at least 15 years or develop a health problem that would prevent you from having lung cancer treatment.  Clinical breast exam.** / Every year after age 27 years.  BRCA-related cancer risk assessment.** / For women who have family members with a BRCA-related cancer (breast, ovarian, tubal, or peritoneal cancers).  Mammogram.** / Every year beginning at age 67 years and continuing for as long as you are in good health. Consult with your health care provider.  Pap test.** / Every 3 years starting at age 97 years through age 43 or 66 years with a history of 3 consecutive normal Pap tests.  HPV screening.** / Every 3 years from ages 3 years through ages 27 to 63 years with a history of 3 consecutive normal Pap tests.  Fecal occult blood test (FOBT) of stool. / Every year beginning at age 71 years and continuing until age 25 years. You may not need to do this test if you get a colonoscopy every 10 years.  Flexible sigmoidoscopy  or colonoscopy.** / Every 5 years for a flexible sigmoidoscopy or every 10 years for a colonoscopy beginning at age 19 years and continuing until age 63 years.  Hepatitis C blood test.** / For all people born from [birth year] through 1967 and any individual with known risks for hepatitis C.  Skin self-exam. / Monthly.  Influenza vaccine. / Every year.  Tetanus, diphtheria, and acellular pertussis (Tdap/Td) vaccine.** / Consult your health care provider. Pregnant women should receive 1 dose of Tdap vaccine during each pregnancy. 1 dose of Td every 10 years.  Varicella vaccine.** / Consult your health care provider. Pregnant females who do not have evidence of immunity should receive the first dose after pregnancy.  Zoster vaccine.** / 1 dose for adults aged 55 years or older.  Measles, mumps, rubella (MMR) vaccine.** / You need at least 1 dose of MMR if you were born in 1957 or later. You may also need a 2nd dose. For females of childbearing age, rubella immunity should be determined. If there is no evidence of immunity, females who are not pregnant should be vaccinated. If there is no evidence of immunity, females who are pregnant should delay immunization until after pregnancy.  Pneumococcal 13-valent conjugate (PCV13) vaccine.** / Consult your health care provider.  Pneumococcal polysaccharide (PPSV23) vaccine.** / 1 to 2 doses if you smoke cigarettes or if you have certain conditions.  Meningococcal vaccine.** /  Consult your health care provider.  Hepatitis A vaccine.** / Consult your health care provider.  Hepatitis B vaccine.** / Consult your health care provider.  Haemophilus influenzae type b (Hib) vaccine.** / Consult your health care provider. Ages 61 years and over  Blood pressure check.** / Every 1 to 2 years.  Lipid and cholesterol check.** / Every 5 years beginning at age 9 years.  Lung cancer screening. / Every year if you are aged 37-80 years and have a 30-pack-year history  of smoking and currently smoke or have quit within the past 15 years. Yearly screening is stopped once you have quit smoking for at least 15 years or develop a health problem that would prevent you from having lung cancer treatment.  Clinical breast exam.** / Every year after age 38 years.  BRCA-related cancer risk assessment.** / For women who have family members with a BRCA-related cancer (breast, ovarian, tubal, or peritoneal cancers).  Mammogram.** / Every year beginning at age 106 years and continuing for as long as you are in good health. Consult with your health care provider.  Pap test.** / Every 3 years starting at age 59 years through age 90 or 10 years with 3 consecutive normal Pap tests. Testing can be stopped between 65 and 70 years with 3 consecutive normal Pap tests and no abnormal Pap or HPV tests in the past 10 years.  HPV screening.** / Every 3 years from ages 45 years through ages 67 or 5 years with a history of 3 consecutive normal Pap tests. Testing can be stopped between 65 and 70 years with 3 consecutive normal Pap tests and no abnormal Pap or HPV tests in the past 10 years.  Fecal occult blood test (FOBT) of stool. / Every year beginning at age 20 years and continuing until age 71 years. You may not need to do this test if you get a colonoscopy every 10 years.  Flexible sigmoidoscopy or colonoscopy.** / Every 5 years for a flexible sigmoidoscopy or every 10 years for a colonoscopy beginning at age 26 years and continuing until age 50 years.  Hepatitis C blood test.** / For all people born from [birth year] through 1967 and any individual with known risks for hepatitis C.  Osteoporosis screening.** / A one-time screening for women ages 67 years and over and women at risk for fractures or osteoporosis.  Skin self-exam. / Monthly.  Influenza vaccine. / Every year.  Tetanus, diphtheria, and acellular pertussis (Tdap/Td) vaccine.** / 1 dose of Td every 10 years.  Varicella  vaccine.** / Consult your health care provider.  Zoster vaccine.** / 1 dose for adults aged 79 years or older.  Pneumococcal 13-valent conjugate (PCV13) vaccine.** / Consult your health care provider.  Pneumococcal polysaccharide (PPSV23) vaccine.** / 1 dose for all adults aged 53 years and older.  Meningococcal vaccine.** / Consult your health care provider.  Hepatitis A vaccine.** / Consult your health care provider.  Hepatitis B vaccine.** / Consult your health care provider.  Haemophilus influenzae type b (Hib) vaccine.** / Consult your health care provider. ** Family history and personal history of risk and conditions may change your health care provider's recommendations. Document Released: 11/29/2001 Document Revised: 02/17/2014 Document Reviewed: 02/28/2011 [facility] Patient Information 2015 [place], [place]. This information is not intended to replace advice given to you by your health care provider. Make sure you discuss any questions you have with your health care provider.

## 2015-03-24 ENCOUNTER — Other Ambulatory Visit: Payer: Self-pay | Admitting: Physician Assistant

## 2015-03-24 DIAGNOSIS — R21 Rash and other nonspecific skin eruption: Secondary | ICD-10-CM

## 2015-03-24 LAB — HIV ANTIBODY (ROUTINE TESTING W REFLEX): HIV: NONREACTIVE

## 2015-10-20 ENCOUNTER — Telehealth: Payer: Self-pay | Admitting: General Practice

## 2015-10-20 NOTE — Telephone Encounter (Signed)
Can you please contact patient. She is scheduled for 8am CPE tomorrow. However her last CPE ws 03/23/15 by Malva Coganody Martin

## 2015-10-21 ENCOUNTER — Other Ambulatory Visit: Payer: Self-pay | Admitting: Family Medicine

## 2015-10-21 ENCOUNTER — Ambulatory Visit (INDEPENDENT_AMBULATORY_CARE_PROVIDER_SITE_OTHER): Payer: 59 | Admitting: Family Medicine

## 2015-10-21 ENCOUNTER — Encounter: Payer: Self-pay | Admitting: Family Medicine

## 2015-10-21 VITALS — BP 97/53 | HR 64 | Temp 98.2°F | Ht 65.0 in | Wt 106.0 lb

## 2015-10-21 DIAGNOSIS — F418 Other specified anxiety disorders: Secondary | ICD-10-CM | POA: Diagnosis not present

## 2015-10-21 DIAGNOSIS — D649 Anemia, unspecified: Secondary | ICD-10-CM

## 2015-10-21 DIAGNOSIS — R6889 Other general symptoms and signs: Secondary | ICD-10-CM | POA: Diagnosis not present

## 2015-10-21 LAB — CBC WITH DIFFERENTIAL/PLATELET
BASOS ABS: 0 10*3/uL (ref 0.0–0.1)
Basophils Relative: 0.5 % (ref 0.0–3.0)
EOS PCT: 0.6 % (ref 0.0–5.0)
Eosinophils Absolute: 0 10*3/uL (ref 0.0–0.7)
HCT: 23.3 % — CL (ref 36.0–46.0)
Lymphocytes Relative: 16 % (ref 12.0–46.0)
Lymphs Abs: 1 10*3/uL (ref 0.7–4.0)
MCHC: 29.9 g/dL — AB (ref 30.0–36.0)
MCV: 67.6 fl — ABNORMAL LOW (ref 78.0–100.0)
MONO ABS: 0.6 10*3/uL (ref 0.1–1.0)
Monocytes Relative: 9.2 % (ref 3.0–12.0)
Neutro Abs: 4.5 10*3/uL (ref 1.4–7.7)
Neutrophils Relative %: 73.7 % (ref 43.0–77.0)
Platelets: 366 10*3/uL (ref 150.0–400.0)
RBC: 3.44 Mil/uL — AB (ref 3.87–5.11)
RDW: 17.4 % — ABNORMAL HIGH (ref 11.5–15.5)
WBC: 6.1 10*3/uL (ref 4.0–10.5)

## 2015-10-21 LAB — BASIC METABOLIC PANEL
BUN: 10 mg/dL (ref 6–23)
CHLORIDE: 105 meq/L (ref 96–112)
CO2: 25 mEq/L (ref 19–32)
CREATININE: 0.89 mg/dL (ref 0.40–1.20)
Calcium: 8.9 mg/dL (ref 8.4–10.5)
GFR: 85.08 mL/min (ref >60.00)
Glucose, Bld: 96 mg/dL (ref 70–99)
POTASSIUM: 3.9 meq/L (ref 3.5–5.1)
Sodium: 139 mEq/L (ref 135–145)

## 2015-10-21 LAB — TSH: TSH: 1.28 u[IU]/mL (ref 0.35–4.50)

## 2015-10-21 MED ORDER — ESCITALOPRAM OXALATE 10 MG PO TABS: 10.0000 mg | ORAL_TABLET | Freq: Every day | ORAL | Status: DC

## 2015-10-21 NOTE — Progress Notes (Signed)
Pre visit review using our clinic review tool, if applicable. No additional management support is needed unless otherwise documented below in the visit note. 

## 2015-10-21 NOTE — Telephone Encounter (Signed)
Could you please add on LFTs- dx unspecified anemia.  Thanks!

## 2015-10-21 NOTE — Telephone Encounter (Signed)
[facility] lab reporting critical -- pts hemoglobin @ 7.0                    Hematocrit @ 23.3

## 2015-10-21 NOTE — Assessment & Plan Note (Signed)
Pt's sxs are consistent w/ anxiety/depression and she is here asking for help.  Denies SI/HI.  She is open to the idea of medication.  Already seeing a therapist.  Suspect that her physical sxs of cold intolerance, increased thirst are somatic manifestations of her anxiety/depression but will get labs to r/o metabolic issue.  Start Lexapro.  Monitor for improvement.  Reviewed crisis information w/ pt.  Will follow closely.

## 2015-10-21 NOTE — Telephone Encounter (Signed)
Lab entered for CBC and IFOB entered ordered as well.

## 2015-10-21 NOTE — Progress Notes (Signed)
   Subjective:    Patient ID: Brianna Hampton, female    DOB: 15-May-1996, 20 y.o.   MRN: [id number]  HPI Pt reports constellation of symptoms- 'always cold, always thirty', increased urination, increased hair loss.  sxs started ~11 months ago.  No known family hx of thyroid problems.  Pt reports 'mostly anxiety' but some depression.  Pt reports 'i don't have time to sleep'.  + family hx of depression/anxiety.  Has not previously taken medication.  [facility]  Therapist recommended she discuss medication.  'i just want to feel better'.  Pt reports she is easily tearful, increased irritability.   Review of Systems For ROS see HPI     Objective:   Physical Exam  Constitutional: She is oriented to person, place, and time. She appears well-developed and well-nourished. No distress.  HENT:  Head: Normocephalic and atraumatic.  Eyes: Conjunctivae and EOM are normal. Pupils are equal, round, and reactive to light.  Neck: Normal range of motion. Neck supple. No thyromegaly present.  Cardiovascular: Normal rate, regular rhythm, normal heart sounds and intact distal pulses.   No murmur heard. Pulmonary/Chest: Effort normal and breath sounds normal. No respiratory distress.  Abdominal: Soft. She exhibits no distension. There is no tenderness.  Musculoskeletal: She exhibits no edema.  Lymphadenopathy:    She has no cervical adenopathy.  Neurological: She is alert and oriented to person, place, and time.  Skin: Skin is warm and dry.  Psychiatric: Her behavior is normal.  Flat affect, somewhat withdrawn  Vitals reviewed.         Assessment & Plan:

## 2015-10-21 NOTE — Patient Instructions (Signed)
Follow up by MyChart in 3-4 weeks We'll notify you of your lab results and make any changes if needed Start the Lexapro once daily Continue seeing your therapist- this is awesome! Try and find a stress outlet- art, music, exercise, journal writing, etc Call with any questions or concerns Hang in there!  We'll figure this out!!

## 2015-10-22 ENCOUNTER — Other Ambulatory Visit (INDEPENDENT_AMBULATORY_CARE_PROVIDER_SITE_OTHER): Payer: 59

## 2015-10-22 DIAGNOSIS — R7989 Other specified abnormal findings of blood chemistry: Secondary | ICD-10-CM | POA: Diagnosis not present

## 2015-10-22 DIAGNOSIS — R945 Abnormal results of liver function studies: Principal | ICD-10-CM

## 2015-10-22 DIAGNOSIS — D649 Anemia, unspecified: Secondary | ICD-10-CM | POA: Diagnosis not present

## 2015-10-22 LAB — CBC WITH DIFFERENTIAL/PLATELET
BASOS ABS: 0 10*3/uL (ref 0.0–0.1)
Basophils Relative: 0.4 % (ref 0.0–3.0)
EOS PCT: 0.6 % (ref 0.0–5.0)
Eosinophils Absolute: 0 10*3/uL (ref 0.0–0.7)
LYMPHS PCT: 19.1 % (ref 12.0–46.0)
Lymphs Abs: 1.3 10*3/uL (ref 0.7–4.0)
MCHC: 30 g/dL (ref 30.0–36.0)
MONOS PCT: 8.7 % (ref 3.0–12.0)
Monocytes Absolute: 0.6 10*3/uL (ref 0.1–1.0)
NEUTROS PCT: 71.2 % (ref 43.0–77.0)
Neutro Abs: 4.7 10*3/uL (ref 1.4–7.7)
Platelets: 352 10*3/uL (ref 150.0–400.0)
RBC: 3.51 Mil/uL — AB (ref 3.87–5.11)
RDW: 17.7 % — ABNORMAL HIGH (ref 11.5–15.5)
WBC: 6.7 10*3/uL (ref 4.0–10.5)

## 2015-10-22 LAB — HEPATIC FUNCTION PANEL
ALBUMIN: 3.7 g/dL (ref 3.5–5.2)
ALK PHOS: 42 U/L (ref 39–117)
ALT: 12 U/L (ref 0–35)
AST: 16 U/L (ref 0–37)
Bilirubin, Direct: 0 mg/dL (ref 0.0–0.3)
Total Bilirubin: 0.2 mg/dL (ref 0.2–1.2)
Total Protein: 6.1 g/dL (ref 6.0–8.3)

## 2015-10-22 NOTE — Telephone Encounter (Signed)
[facility] lab reporting critical -- pts hemoglobin @ 7.2                               hematocrit @ 23.9  LFT was added currently pending..Marland Kitchen

## 2015-10-28 ENCOUNTER — Telehealth: Payer: Self-pay | Admitting: Family Medicine

## 2015-10-28 DIAGNOSIS — D649 Anemia, unspecified: Secondary | ICD-10-CM

## 2015-10-28 NOTE — Telephone Encounter (Signed)
Put order in for repeat CBC per lab results instructions.

## 2015-11-05 ENCOUNTER — Telehealth: Payer: Self-pay | Admitting: Family Medicine

## 2015-11-05 NOTE — Telephone Encounter (Signed)
Faxed lab order to [facility] lab per patient request.

## 2015-11-05 NOTE — Telephone Encounter (Signed)
Caller name: Self  Can be reached: [phone]   Reason for call: Patient is requesting to have order for labs that she need faxed to [facility], [address] [place], [place] [address]  Phone: [phone] Fax Number [phone]. Patient states that her labs are due the first week of Feb.

## 2015-11-20 ENCOUNTER — Other Ambulatory Visit: Payer: Self-pay | Admitting: Physician Assistant

## 2015-11-21 LAB — HIV ANTIBODY (ROUTINE TESTING W REFLEX): HIV 1&2 Ab, 4th Generation: NONREACTIVE

## 2015-11-23 ENCOUNTER — Other Ambulatory Visit: Payer: 59

## 2016-02-11 ENCOUNTER — Telehealth: Payer: Self-pay | Admitting: Family Medicine

## 2016-02-11 NOTE — Telephone Encounter (Signed)
Called pt and LMOVM to advise that I am unable to send in the medication until she can clarify the address of the pharmacy as there are two in [place], [place]

## 2016-02-11 NOTE — Telephone Encounter (Signed)
Pt states that she [facility] [place]

## 2016-02-12 MED ORDER — ESCITALOPRAM OXALATE 10 MG PO TABS: 10.0000 mg | ORAL_TABLET | Freq: Every day | ORAL | Status: DC

## 2016-02-12 NOTE — Telephone Encounter (Signed)
Called pt again today and LMOVM to have [facility]

## 2016-02-12 NOTE — Telephone Encounter (Signed)
Medication filled to pharmacy as requested.   

## 2016-08-25 ENCOUNTER — Telehealth: Payer: Self-pay | Admitting: Family Medicine

## 2016-08-25 NOTE — Telephone Encounter (Signed)
Patent would like to transfer from Dr. Beverely Lowabori to Alma DownsMelissa O. Please advise

## 2016-08-26 NOTE — Telephone Encounter (Signed)
Ok to transfer. 

## 2016-08-27 NOTE — Telephone Encounter (Signed)
Ok

## 2016-08-29 NOTE — Telephone Encounter (Signed)
Patient scheduled for 09/06/2016

## 2016-09-05 ENCOUNTER — Encounter: Payer: Self-pay | Admitting: Behavioral Health

## 2016-09-05 ENCOUNTER — Telehealth: Payer: Self-pay | Admitting: Behavioral Health

## 2016-09-05 NOTE — Telephone Encounter (Signed)
Pre-Visit Call completed with patient and chart updated.   Pre-Visit Info documented in Specialty Comments under [employer].    

## 2016-09-06 ENCOUNTER — Encounter: Payer: Self-pay | Admitting: Family

## 2016-09-06 ENCOUNTER — Ambulatory Visit (INDEPENDENT_AMBULATORY_CARE_PROVIDER_SITE_OTHER): Payer: 59 | Admitting: Family

## 2016-09-06 VITALS — BP 125/66 | HR 81 | Temp 98.1°F | Resp 18 | Ht 65.0 in | Wt 145.8 lb

## 2016-09-06 DIAGNOSIS — N926 Irregular menstruation, unspecified: Secondary | ICD-10-CM | POA: Diagnosis not present

## 2016-09-06 DIAGNOSIS — R635 Abnormal weight gain: Secondary | ICD-10-CM

## 2016-09-06 DIAGNOSIS — D649 Anemia, unspecified: Secondary | ICD-10-CM

## 2016-09-06 LAB — POCT HEMOGLOBIN: Hemoglobin: 14.9 g/dL (ref 12.2–16.2)

## 2016-09-06 NOTE — Patient Instructions (Addendum)
Please complete lab work prior to leaving. Continue your iron supplement.  Complete stool study and return via mail at your earliest convenience. Continue regular exercise and watch your caloric intake to help prevent further weight gain.

## 2016-09-06 NOTE — Progress Notes (Signed)
Subjective:    Patient ID: Brianna Hampton, female    DOB: 10-22-1995, 20 y.o.   MRN: [id number]  HPI   Ms. Deprey is a 20 yr old female who presents today to establish with me. She was previously followed by another provider who has since changed offices.  Her chief complaint today is menstrual spotting.  Reports that she has been having spotting for 3 months. She has also had a 40 pound weight gain since January. She is followed by Dr. Benjie Karvonen at [facility].   Wt Readings from Last 3 Encounters:  09/06/16 145 lb 12.8 oz (66.1 kg)  10/21/15 106 lb (48.1 kg)  03/23/15 108 lb (49 kg)   Anemia- of note, she had avery low hgb back in January of this year. Hg was 7.2. She was advised to add iron and complete an IFOB.  She was also advised to complete follow up lab work last February which was not completed. IFOB was not completed either.  She reports + fatigue, denies SOB.  She continues iron once daily. She is taking with a stool softner.  Review of Systems    see HPI  Past Medical History:  Diagnosis Date  . Scoliosis      Social History   Social History  . Marital status: Single    Spouse name: N/A  . Number of children: N/A  . Years of education: N/A   Occupational History  . Student    Social History Main Topics  . Smoking status: Never Smoker  . Smokeless tobacco: Never Used  . Alcohol use 0.0 oz/week     Comment: rare  . Drug use: No  . Sexual activity: Not Currently   Other Topics Concern  . Not on file   Social History Narrative  . No narrative on file    Past Surgical History:  Procedure Laterality Date  . RHINOPLASTY      Family History  Problem Relation Age of Onset  . Arthritis Maternal Grandmother   . Heart disease Maternal Grandmother   . Arthritis Maternal Grandfather   . Arthritis Paternal Grandmother   . Arthritis Paternal Grandfather   . Cancer Paternal Aunt     breast and lung    Allergies  Allergen Reactions  . Gluten Meal   . Latex Rash      Current Outpatient Prescriptions on File Prior to Visit  Medication Sig Dispense Refill  . escitalopram (LEXAPRO) 10 MG tablet Take 1 tablet (10 mg total) by mouth daily. 30 tablet 6  . ferrous sulfate 325 (65 FE) MG tablet Take 325 mg by mouth daily with breakfast.    . Norethin Ace-Eth Estrad-FE (MINASTRIN 24 FE PO) Take by mouth.     No current facility-administered medications on file prior to visit.     BP 125/66 (BP Location: Right Arm, Cuff Size: Normal)   Pulse 81   Temp 98.1 F (36.7 C) (Oral)   Resp 18   Ht '5\' 5"'$  (1.651 m)   Wt 145 lb 12.8 oz (66.1 kg)   SpO2 99% Comment: room air  BMI 24.26 kg/m    Objective:   Physical Exam  Constitutional: She is oriented to person, place, and time. She appears well-developed and well-nourished.  HENT:  Head: Normocephalic and atraumatic.  Cardiovascular: Normal rate, regular rhythm and normal heart sounds.   No murmur heard. Pulmonary/Chest: Effort normal and breath sounds normal. No respiratory distress. She has no wheezes.  Neurological: She is alert and  oriented to person, place, and time.  Skin: Skin is warm and dry.  Psychiatric: She has a normal mood and affect. Her behavior is normal. Judgment and thought content normal.          Assessment & Plan:  Irregular menses- advised pt to follow back up with GYN who is managing her OCP.  Anemia-  Continue iron supplement. Advised pt to complete IFOB kit and return via mail at her earliest convenience.   Weight Gain- attributes to OCP. Will check TSH. Continue healthy diet, exercise.

## 2016-09-06 NOTE — Progress Notes (Signed)
Pre visit review using our clinic review tool, if applicable. No additional management support is needed unless otherwise documented below in the visit note. 

## 2016-09-07 LAB — FERRITIN: Ferritin: 23.1 ng/mL (ref 10.0–291.0)

## 2016-09-07 LAB — CBC WITH DIFFERENTIAL/PLATELET
BASOS PCT: 0.8 % (ref 0.0–3.0)
Basophils Absolute: 0.1 10*3/uL (ref 0.0–0.1)
EOS PCT: 1.5 % (ref 0.0–5.0)
Eosinophils Absolute: 0.1 10*3/uL (ref 0.0–0.7)
HCT: 41.8 % (ref 36.0–46.0)
Hemoglobin: 14.2 g/dL (ref 12.0–15.0)
LYMPHS ABS: 1.8 10*3/uL (ref 0.7–4.0)
Lymphocytes Relative: 23 % (ref 12.0–46.0)
MCHC: 34 g/dL (ref 30.0–36.0)
MCV: 89.4 fl (ref 78.0–100.0)
MONOS PCT: 10.2 % (ref 3.0–12.0)
Monocytes Absolute: 0.8 10*3/uL (ref 0.1–1.0)
NEUTROS PCT: 64.5 % (ref 43.0–77.0)
Neutro Abs: 5 10*3/uL (ref 1.4–7.7)
Platelets: 246 10*3/uL (ref 150.0–400.0)
RBC: 4.68 Mil/uL (ref 3.87–5.11)
RDW: 13.2 % (ref 11.5–15.5)
WBC: 7.7 10*3/uL (ref 4.0–10.5)

## 2016-09-07 LAB — HCG, SERUM, QUALITATIVE: PREG SERUM: NEGATIVE

## 2016-09-07 LAB — TSH: TSH: 1.92 u[IU]/mL (ref 0.35–4.50)

## 2016-09-07 LAB — IRON: Iron: 73 ug/dL (ref 42–145)

## 2016-09-11 ENCOUNTER — Other Ambulatory Visit: Payer: Self-pay | Admitting: Family Medicine

## 2016-09-11 ENCOUNTER — Encounter: Payer: Self-pay | Admitting: Family

## 2016-09-11 DIAGNOSIS — E875 Hyperkalemia: Secondary | ICD-10-CM

## 2016-09-13 ENCOUNTER — Other Ambulatory Visit (INDEPENDENT_AMBULATORY_CARE_PROVIDER_SITE_OTHER): Payer: 59

## 2016-09-13 DIAGNOSIS — D649 Anemia, unspecified: Secondary | ICD-10-CM

## 2016-09-13 LAB — FECAL OCCULT BLOOD, IMMUNOCHEMICAL: Fecal Occult Bld: NEGATIVE

## 2016-10-07 ENCOUNTER — Ambulatory Visit (HOSPITAL_BASED_OUTPATIENT_CLINIC_OR_DEPARTMENT_OTHER)
Admission: RE | Admit: 2016-10-07 | Discharge: 2016-10-07 | Disposition: A | Discharge status: Home / Self Care | Payer: 59 | Source: Ambulatory Visit | Attending: Family | Admitting: Family

## 2016-10-07 ENCOUNTER — Encounter: Payer: Self-pay | Admitting: Family

## 2016-10-07 ENCOUNTER — Ambulatory Visit (INDEPENDENT_AMBULATORY_CARE_PROVIDER_SITE_OTHER): Payer: 59 | Admitting: Family

## 2016-10-07 ENCOUNTER — Other Ambulatory Visit (HOSPITAL_COMMUNITY)
Admission: RE | Admit: 2016-10-07 | Discharge: 2016-10-07 | Disposition: A | Discharge status: Home / Self Care | Payer: 59 | Source: Ambulatory Visit | Attending: Family Medicine | Admitting: Family Medicine

## 2016-10-07 VITALS — BP 106/70 | HR 72 | Temp 97.7°F | Resp 16 | Ht 65.0 in | Wt 142.6 lb

## 2016-10-07 DIAGNOSIS — Z01419 Encounter for gynecological examination (general) (routine) without abnormal findings: Secondary | ICD-10-CM | POA: Insufficient documentation

## 2016-10-07 DIAGNOSIS — Z23 Encounter for immunization: Secondary | ICD-10-CM | POA: Diagnosis not present

## 2016-10-07 DIAGNOSIS — Z0000 Encounter for general adult medical examination without abnormal findings: Secondary | ICD-10-CM

## 2016-10-07 DIAGNOSIS — E010 Iodine-deficiency related diffuse (endemic) goiter: Secondary | ICD-10-CM | POA: Insufficient documentation

## 2016-10-07 LAB — BASIC METABOLIC PANEL
BUN: 10 mg/dL (ref 6–23)
CALCIUM: 9.7 mg/dL (ref 8.4–10.5)
CO2: 31 mEq/L (ref 19–32)
CREATININE: 0.8 mg/dL (ref 0.40–1.20)
Chloride: 106 mEq/L (ref 96–112)
GFR: 95.35 mL/min (ref >60.00)
GLUCOSE: 99 mg/dL (ref 70–99)
Potassium: 5.4 mEq/L — ABNORMAL HIGH (ref 3.5–5.1)
Sodium: 143 mEq/L (ref 135–145)

## 2016-10-07 LAB — URINALYSIS, ROUTINE W REFLEX MICROSCOPIC
BILIRUBIN URINE: NEGATIVE
Ketones, ur: NEGATIVE
LEUKOCYTES UA: NEGATIVE
NITRITE: NEGATIVE
Specific Gravity, Urine: <=1.005 — AB (ref 1.000–1.030)
Total Protein, Urine: NEGATIVE
URINE GLUCOSE: NEGATIVE
Urobilinogen, UA: 0.2 (ref 0.0–1.0)
WBC UA: NONE SEEN (ref 0–?)
pH: 7 (ref 5.0–8.0)

## 2016-10-07 LAB — LIPID PANEL
CHOLESTEROL: 142 mg/dL (ref 0–200)
HDL: 54.3 mg/dL (ref >39.00)
LDL Cholesterol: 66 mg/dL (ref 0–99)
NonHDL: 87.85
Total CHOL/HDL Ratio: 3
Triglycerides: 109 mg/dL (ref 0.0–149.0)
VLDL: 21.8 mg/dL (ref 0.0–40.0)

## 2016-10-07 LAB — HEPATIC FUNCTION PANEL
ALBUMIN: 4.3 g/dL (ref 3.5–5.2)
ALT: 17 U/L (ref 0–35)
AST: 24 U/L (ref 0–37)
Alkaline Phosphatase: 80 U/L (ref 39–117)
Bilirubin, Direct: 0.1 mg/dL (ref 0.0–0.3)
Total Bilirubin: 0.3 mg/dL (ref 0.2–1.2)
Total Protein: 7.2 g/dL (ref 6.0–8.3)

## 2016-10-07 NOTE — Progress Notes (Signed)
Subjective:    Patient ID: Brianna Hampton, female    DOB: [birth year], 20 y.o.   MRN: [id number]  HPI  Patient presents today for complete physical.  Immunizations: tetanus due, had a flu shot Diet: healthy Exercise: running/weights/yoga. Pap Smear: due   Review of Systems  Constitutional: Negative for unexpected weight change.  HENT: Negative for hearing loss and rhinorrhea.   Eyes: Negative for visual disturbance.  Respiratory: Negative for cough.   Cardiovascular: Negative for leg swelling.  Gastrointestinal: Negative for constipation and diarrhea.  Genitourinary: Negative for dysuria and frequency.  Musculoskeletal: Negative for arthralgias and myalgias.  Skin: Negative for rash.  Neurological: Negative for headaches.  Hematological: Negative for adenopathy.  Psychiatric/Behavioral:       Reports depression/anxiety stable       Past Medical History:  Diagnosis Date  . Scoliosis      Social History   Social History  . Marital status: Single    Spouse name: N/A  . Number of children: N/A  . Years of education: N/A   Occupational History  . Student    Social History Main Topics  . Smoking status: Never Smoker  . Smokeless tobacco: Never Used  . Alcohol use 0.0 oz/week     Comment: rare  . Drug use: No  . Sexual activity: Not Currently   Other Topics Concern  . 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    1 dog lives with her parents        Past Surgical History:  Procedure Laterality Date  . RHINOPLASTY      Family History  Problem Relation Age of Onset  . Hyperlipidemia Father   . Arthritis Maternal Grandmother   . Heart disease Maternal Grandmother   . Arthritis Maternal Grandfather   . Arthritis Paternal Grandmother   . Arthritis Paternal Grandfather   . Cancer Paternal Aunt     breast and lung    Allergies   Allergen Reactions  . Gluten Meal Diarrhea and Nausea And Vomiting  . Latex Rash    Current Outpatient Prescriptions on File Prior to Visit  Medication Sig Dispense Refill  . escitalopram (LEXAPRO) 10 MG tablet TAKE ONE TABLET BY MOUTH ONCE DAILY 30 tablet 5  . ferrous sulfate 325 (65 FE) MG tablet Take 325 mg by mouth daily with breakfast.     No current facility-administered medications on file prior to visit.     BP 106/70   Pulse 72   Temp 97.7 F (36.5 C) (Oral)   Resp 16   Ht 5\' 5"  (1.651 m)   Wt 142 lb 9.6 oz (64.7 kg)   LMP 10/07/2016   SpO2 98% Comment: room air  BMI 23.73 kg/m    Objective:   Physical Exam  Physical Exam  Constitutional: She is oriented to person, place, and time. She appears well-developed and well-nourished. No distress.  HENT:  Head: Normocephalic and atraumatic.  Right Ear: Tympanic membrane and ear canal normal.  Left Ear: Tympanic membrane and ear canal normal.  Mouth/Throat: Oropharynx is clear and moist.  Eyes: Pupils are equal, round, and reactive to light. No scleral icterus.  Neck: Normal range of motion. + thyromegaly present.  Cardiovascular: Normal rate and regular rhythm.   No murmur heard. Pulmonary/Chest: Effort normal and breath sounds normal. No respiratory distress. He has no wheezes. She  has no rales. She exhibits no tenderness.  Abdominal: Soft. Bowel sounds are normal. She exhibits no distension and no mass. There is no tenderness. There is no rebound and no guarding.  Musculoskeletal: She exhibits no edema.  Lymphadenopathy:    She has no cervical adenopathy.  Neurological: She is alert and oriented to person, place, and time. She has normal patellar reflexes. She exhibits normal muscle tone. Coordination normal.  Skin: Skin is warm and dry.  Psychiatric: She has a normal mood and affect. Her behavior is normal. Judgment and thought content normal.  Breasts: Examined lying Right: Without masses, retractions,  discharge or axillary adenopathy.  Left: Without masses, retractions, discharge or axillary adenopathy.  Inguinal/mons: Normal without inguinal adenopathy  External genitalia: Normal  BUS/Urethra/Skene's glands: Normal  Bladder: Normal  Vagina: Normal  Cervix: Normal  (blood in vaginal vault, partially obscuring cervix).  Uterus: normal in size, shape and contour. Midline and mobile  Adnexa/parametria:  Rt: Without masses or tenderness.  Lt: Without masses or tenderness.  Anus and perineum: Normal     Assessment & Plan:    Thyromegaly- refer for US for further evaluation.  Preventative Care- continue healthy diet, exercise, Td today. Obtain routine lab work.  Pap performed today.     Assessment & Plan:

## 2016-10-07 NOTE — Addendum Note (Signed)
Addended by: Mervin KungFERGERSON, Daryle Boyington A on: 10/07/2016 05:18 PM   Modules accepted: Orders

## 2016-10-07 NOTE — Patient Instructions (Signed)
Please complete lab work prior to leaving.   

## 2016-10-07 NOTE — Addendum Note (Signed)
Addended by: Mervin KungFERGERSON, Aleli Navedo A on: 10/07/2016 03:47 PM   Modules accepted: Orders

## 2016-10-07 NOTE — Progress Notes (Signed)
Pre visit review using our clinic review tool, if applicable. No additional management support is needed unless otherwise documented below in the visit note. 

## 2016-10-08 NOTE — Telephone Encounter (Signed)
Potassium is mildly elevated.  Please repeat bmet in 1-2 weeks.  Dx hyperkalemia.

## 2016-10-12 LAB — CYTOLOGY - PAP
ADEQUACY: ABSENT
Diagnosis: NEGATIVE

## 2016-10-18 NOTE — Telephone Encounter (Signed)
Notified pt and she voices understanding. States she will be in town on 10/24/16. Lab appt scheduled for 10/24/16 at 7am. Future order entered.

## 2016-10-24 ENCOUNTER — Other Ambulatory Visit (INDEPENDENT_AMBULATORY_CARE_PROVIDER_SITE_OTHER): Payer: 59

## 2016-10-24 DIAGNOSIS — E875 Hyperkalemia: Secondary | ICD-10-CM | POA: Diagnosis not present

## 2016-10-24 LAB — BASIC METABOLIC PANEL
BUN: 11 mg/dL (ref 6–23)
CALCIUM: 9.5 mg/dL (ref 8.4–10.5)
CHLORIDE: 101 meq/L (ref 96–112)
CO2: 27 meq/L (ref 19–32)
Creatinine, Ser: 0.84 mg/dL (ref 0.40–1.20)
GFR: 90.09 mL/min (ref >60.00)
Glucose, Bld: 97 mg/dL (ref 70–99)
Potassium: 3.5 mEq/L (ref 3.5–5.1)
SODIUM: 138 meq/L (ref 135–145)

## 2016-12-26 DIAGNOSIS — K6289 Other specified diseases of anus and rectum: Secondary | ICD-10-CM | POA: Diagnosis not present

## 2016-12-26 DIAGNOSIS — R140 Abdominal distension (gaseous): Secondary | ICD-10-CM | POA: Diagnosis not present

## 2017-01-02 DIAGNOSIS — K259 Gastric ulcer, unspecified as acute or chronic, without hemorrhage or perforation: Secondary | ICD-10-CM | POA: Diagnosis not present

## 2017-01-02 DIAGNOSIS — K3189 Other diseases of stomach and duodenum: Secondary | ICD-10-CM | POA: Diagnosis not present

## 2017-01-02 DIAGNOSIS — K2960 Other gastritis without bleeding: Secondary | ICD-10-CM | POA: Diagnosis not present

## 2017-01-02 DIAGNOSIS — K319 Disease of stomach and duodenum, unspecified: Secondary | ICD-10-CM | POA: Diagnosis not present

## 2017-01-02 DIAGNOSIS — K2970 Gastritis, unspecified, without bleeding: Secondary | ICD-10-CM | POA: Diagnosis not present

## 2017-01-03 DIAGNOSIS — R109 Unspecified abdominal pain: Secondary | ICD-10-CM | POA: Diagnosis not present

## 2017-01-03 DIAGNOSIS — K2970 Gastritis, unspecified, without bleeding: Secondary | ICD-10-CM | POA: Diagnosis not present

## 2017-03-10 ENCOUNTER — Other Ambulatory Visit: Payer: Self-pay | Admitting: Family

## 2017-03-10 NOTE — Telephone Encounter (Signed)
Received refill request for Lexapro 10mg  (take 1 tab po qd) #30, 5RF  Last RF: 02/08/2017 Last OV: 10/12/2017 Next OV: 04/10/2017 UDS: None  Forwarded to Provider for review, approval or denial.

## 2017-03-24 DIAGNOSIS — Z6823 Body mass index (BMI) 23.0-23.9, adult: Secondary | ICD-10-CM | POA: Diagnosis not present

## 2017-03-24 DIAGNOSIS — Z118 Encounter for screening for other infectious and parasitic diseases: Secondary | ICD-10-CM | POA: Diagnosis not present

## 2017-03-24 DIAGNOSIS — Z01419 Encounter for gynecological examination (general) (routine) without abnormal findings: Secondary | ICD-10-CM | POA: Diagnosis not present

## 2017-03-30 DIAGNOSIS — R140 Abdominal distension (gaseous): Secondary | ICD-10-CM | POA: Diagnosis not present

## 2017-03-30 DIAGNOSIS — K259 Gastric ulcer, unspecified as acute or chronic, without hemorrhage or perforation: Secondary | ICD-10-CM | POA: Diagnosis not present

## 2017-04-06 ENCOUNTER — Ambulatory Visit (INDEPENDENT_AMBULATORY_CARE_PROVIDER_SITE_OTHER): Payer: 59 | Admitting: Family

## 2017-04-06 ENCOUNTER — Encounter: Payer: Self-pay | Admitting: Family

## 2017-04-06 VITALS — BP 100/61 | HR 76 | Temp 98.0°F | Resp 16 | Ht 65.0 in | Wt 138.2 lb

## 2017-04-06 DIAGNOSIS — D509 Iron deficiency anemia, unspecified: Secondary | ICD-10-CM

## 2017-04-06 DIAGNOSIS — R5383 Other fatigue: Secondary | ICD-10-CM | POA: Diagnosis not present

## 2017-04-06 DIAGNOSIS — F418 Other specified anxiety disorders: Secondary | ICD-10-CM

## 2017-04-06 LAB — CBC WITH DIFFERENTIAL/PLATELET
Basophils Absolute: 0 10*3/uL (ref 0.0–0.1)
Basophils Relative: 0.5 % (ref 0.0–3.0)
EOS ABS: 0.1 10*3/uL (ref 0.0–0.7)
EOS PCT: 0.5 % (ref 0.0–5.0)
HCT: 41.6 % (ref 36.0–46.0)
HEMOGLOBIN: 13.9 g/dL (ref 12.0–15.0)
Lymphocytes Relative: 17.9 % (ref 12.0–46.0)
Lymphs Abs: 1.8 10*3/uL (ref 0.7–4.0)
MCHC: 33.4 g/dL (ref 30.0–36.0)
MCV: 90.8 fl (ref 78.0–100.0)
MONO ABS: 0.7 10*3/uL (ref 0.1–1.0)
Monocytes Relative: 7.1 % (ref 3.0–12.0)
Neutro Abs: 7.6 10*3/uL (ref 1.4–7.7)
Neutrophils Relative %: 74 % (ref 43.0–77.0)
Platelets: 254 10*3/uL (ref 150.0–400.0)
RBC: 4.58 Mil/uL (ref 3.87–5.11)
RDW: 12.6 % (ref 11.5–15.5)
WBC: 10.3 10*3/uL (ref 4.0–10.5)

## 2017-04-06 LAB — TSH: TSH: 1.74 u[IU]/mL (ref 0.35–4.50)

## 2017-04-06 LAB — IRON: IRON: 92 ug/dL (ref 42–145)

## 2017-04-06 MED ORDER — ESCITALOPRAM OXALATE 10 MG PO TABS: 10.0000 mg | ORAL_TABLET | Freq: Every day | ORAL | 1 refills | Status: DC

## 2017-04-06 NOTE — Patient Instructions (Addendum)
Please complete lab work prior to leaving.   

## 2017-04-06 NOTE — Progress Notes (Signed)
Subjective:    Patient ID: Brianna Hampton, female    DOB: 05-Jan-1996, 21 y.o.   MRN: [id number]  HPI  Ms. Brianna Hampton is a 21 yr old female who presents today for follow up.  Depression/Anxiety- she is maintained on lexapro 10 mg.  She reports that her mood is good and anxiety is well controlled.  She denies side effects from the medication.   Anemia- Was off of iron for 2-3 months.   Lab Results  Component Value Date   WBC 7.7 09/06/2016   HGB 14.2 09/06/2016   HCT 41.8 09/06/2016   MCV 89.4 09/06/2016   PLT 246.0 09/06/2016   GERD-following with GI and will have endoscopy.  Continues protonix. Sees Dr. Myrtis Hampton ( in [place])  Review of Systems See HPI  Past Medical History:  Diagnosis Date  . Scoliosis      Social History   Social History  . Marital status: Single    Spouse name: N/A  . Number of children: N/A  . Years of education: N/A   Occupational History  . Student    Social History Main Topics  . Smoking status: Never Smoker  . Smokeless tobacco: Never Used  . Alcohol use 0.0 oz/week     Comment: rare  . Drug use: No  . Sexual activity: Not Currently   Other Topics Concern  . 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    1 dog lives with her parents        Past Surgical History:  Procedure Laterality Date  . RHINOPLASTY      Family History  Problem Relation Age of Onset  . Hyperlipidemia Father   . Arthritis Maternal Grandmother   . Heart disease Maternal Grandmother   . Arthritis Maternal Grandfather   . Arthritis Paternal Grandmother   . Arthritis Paternal Grandfather   . Cancer Paternal Aunt        breast and lung    Allergies  Allergen Reactions  . Gluten Meal Diarrhea and Nausea And Vomiting  . Latex Rash    Current Outpatient Prescriptions on File Prior to Visit  Medication Sig Dispense Refill  .  escitalopram (LEXAPRO) 10 MG tablet TAKE ONE TABLET BY MOUTH ONCE DAILY 30 tablet 0  . ferrous sulfate 325 (65 FE) MG tablet Take 325 mg by mouth daily with breakfast.    . MIBELAS 24 FE 1-20 MG-MCG(24) CHEW Chew 1 tablet by mouth daily.     No current facility-administered medications on file prior to visit.     BP 100/61 (BP Location: Right Arm, Cuff Size: Normal)   Pulse 76   Temp 98 F (36.7 C) (Oral)   Resp 16   Ht 5\' 5"  (1.651 m)   Wt 138 lb 3.2 oz (62.7 kg)   LMP 03/23/2017   SpO2 99%   BMI 23.00 kg/m       Objective:   Physical Exam  Constitutional: She is oriented to person, place, and time. She appears well-developed and well-nourished.  Cardiovascular: Normal rate, regular rhythm and normal heart sounds.   No murmur heard. Pulmonary/Chest: Effort normal and breath sounds normal. No respiratory distress. She has no wheezes.  Neurological: She is alert and oriented to person, place, and time.  Psychiatric: She has a normal mood and affect. Her behavior is normal. Judgment and thought content normal.  Assessment & Plan:  Iron def anemia- check follow up CBC, iron. Continue iron supplement.   Fatigue- could be due to anemia but will check TSH to rule out hypothyroid.

## 2017-04-06 NOTE — Assessment & Plan Note (Signed)
Stable on lexapro, continue same.  

## 2017-04-07 ENCOUNTER — Ambulatory Visit: Payer: 59 | Admitting: Family

## 2017-04-07 DIAGNOSIS — K259 Gastric ulcer, unspecified as acute or chronic, without hemorrhage or perforation: Secondary | ICD-10-CM | POA: Diagnosis not present

## 2017-04-07 DIAGNOSIS — R140 Abdominal distension (gaseous): Secondary | ICD-10-CM | POA: Diagnosis not present

## 2017-04-07 DIAGNOSIS — K2970 Gastritis, unspecified, without bleeding: Secondary | ICD-10-CM | POA: Diagnosis not present

## 2017-04-10 ENCOUNTER — Ambulatory Visit: Payer: 59 | Admitting: Family

## 2017-06-04 ENCOUNTER — Encounter: Payer: Self-pay | Admitting: Family

## 2017-06-06 MED ORDER — ESCITALOPRAM OXALATE 10 MG PO TABS: 10.0000 mg | ORAL_TABLET | Freq: Every day | ORAL | 1 refills | Status: DC

## 2017-06-06 NOTE — Telephone Encounter (Signed)
Spoke with pt and verified that she is already at new address. Rx sent to [facility].

## 2017-06-22 ENCOUNTER — Telehealth: Payer: Self-pay | Admitting: Family

## 2017-06-22 NOTE — Telephone Encounter (Addendum)
[facility] never received RX, please advise. Patient is requesting to have sent to local pharmacy, she is out of meds and going through withdrawals[facility] [place] Please advise. Patient will not be available till after 4:30 today, please call [phone]

## 2017-06-22 NOTE — Telephone Encounter (Signed)
Per pt request please mark this urgent. Pt called she has been out of Lexapro for several days. 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 [facility] [place] [facility] to [place] but then they said our office denied it. Pt can only be contacted today or tomorrow on her Lunch break at noon Friday until 12:25. The job is new and she cannot keep leaving the classroom.   Patient is requesting to have sent to local pharmacy, she is out of meds and going through withdrawals. [facility] [place] Please advise. Patient will not be available till after 4:30 today, please call [phone]    Please call in Lexapro

## 2017-06-23 ENCOUNTER — Telehealth: Payer: Self-pay | Admitting: Family

## 2017-06-23 DIAGNOSIS — Z23 Encounter for immunization: Secondary | ICD-10-CM | POA: Diagnosis not present

## 2017-06-23 MED ORDER — ESCITALOPRAM OXALATE 10 MG PO TABS: 10.0000 mg | ORAL_TABLET | Freq: Every day | ORAL | 1 refills | Status: DC

## 2017-06-23 NOTE — Telephone Encounter (Signed)
Spoke with pt. She states [facility] but was [facility] as she had called around last night and [facility] so she could get Rx soon. See previous phone note from 06/22/17. Advised pt when I came in this morning that previous phone note [facility] [place] and that pt even returned my call this morning to verify which [facility] to [facility]. Also advised her that I called verbal Rx to [facility], #90 x 1 refill and they said it would ship in 7-10 business days. Pt voices understanding. Denied SI / HI and confirmed that [facility]

## 2017-06-23 NOTE — Telephone Encounter (Signed)
See 06/22/17 phone note.

## 2017-06-23 NOTE — Telephone Encounter (Signed)
New Message  Pt voiced wanting to know why [facility] when it was the only solution to her getting her meds with her being without for 13 days.  Pt voiced she would like to speak with nurse/MD about what happened.  Pt voiced [facility] they only had one refill left of that prescription and they was not going to send it next month knowing she has more refills.  Please f/u with pt

## 2017-06-23 NOTE — Telephone Encounter (Signed)
Patient called back to confirm [facility] [address]  in [place]

## 2017-06-23 NOTE — Telephone Encounter (Signed)
[facility]. [facility] and they tell me they never received previous Rx on 06/06/17. Also stated they were showing a paid claim [facility] [place], [phone]. Gave verbal for 90 day supply x 1 refill of escitalopram to [facility]. [facility] and [facility] rx with [facility] that was filled yesterday as [facility] is [facility]. Sent email to pt.

## 2017-06-23 NOTE — Telephone Encounter (Signed)
[facility]ed to find [facility] [place] and found 3 possible alternatives: [address], [address] and [address] [facility] [address] Left message on pt's cell # to send mychart message to let us know which location she wants Rx to go to. I will also contact [facility] to find out what happened to Rx sent to them on 06/06/17. Awaiting pt response.

## 2017-07-05 ENCOUNTER — Encounter: Payer: Self-pay | Admitting: Family

## 2017-07-07 MED ORDER — ESCITALOPRAM OXALATE 10 MG PO TABS
10.0000 mg | ORAL_TABLET | Freq: Every day | ORAL | 1 refills | Status: DC
Start: 2017-07-07 — End: 2017-12-25

## 2017-10-18 ENCOUNTER — Ambulatory Visit (INDEPENDENT_AMBULATORY_CARE_PROVIDER_SITE_OTHER): Payer: 59 | Admitting: Family

## 2017-10-18 ENCOUNTER — Encounter: Payer: Self-pay | Admitting: Family

## 2017-10-18 VITALS — BP 111/65 | HR 57 | Temp 97.9°F | Resp 16 | Ht 65.0 in | Wt 138.0 lb

## 2017-10-18 DIAGNOSIS — Z0000 Encounter for general adult medical examination without abnormal findings: Secondary | ICD-10-CM

## 2017-10-18 LAB — CBC WITH DIFFERENTIAL/PLATELET
BASOS PCT: 0.5 % (ref 0.0–3.0)
Basophils Absolute: 0 10*3/uL (ref 0.0–0.1)
EOS PCT: 0.8 % (ref 0.0–5.0)
Eosinophils Absolute: 0.1 10*3/uL (ref 0.0–0.7)
HEMATOCRIT: 43.6 % (ref 36.0–46.0)
HEMOGLOBIN: 14.8 g/dL (ref 12.0–15.0)
LYMPHS PCT: 23 % (ref 12.0–46.0)
Lymphs Abs: 1.5 10*3/uL (ref 0.7–4.0)
MCHC: 34 g/dL (ref 30.0–36.0)
MCV: 89.9 fl (ref 78.0–100.0)
Monocytes Absolute: 0.6 10*3/uL (ref 0.1–1.0)
Monocytes Relative: 9.2 % (ref 3.0–12.0)
Neutro Abs: 4.4 10*3/uL (ref 1.4–7.7)
Neutrophils Relative %: 66.5 % (ref 43.0–77.0)
Platelets: 214 10*3/uL (ref 150.0–400.0)
RBC: 4.85 Mil/uL (ref 3.87–5.11)
RDW: 12.8 % (ref 11.5–15.5)
WBC: 6.6 10*3/uL (ref 4.0–10.5)

## 2017-10-18 LAB — LIPID PANEL
CHOLESTEROL: 124 mg/dL (ref 0–200)
HDL: 44.2 mg/dL (ref >39.00)
LDL Cholesterol: 65 mg/dL (ref 0–99)
NonHDL: 79.91
TRIGLYCERIDES: 73 mg/dL (ref 0.0–149.0)
Total CHOL/HDL Ratio: 3
VLDL: 14.6 mg/dL (ref 0.0–40.0)

## 2017-10-18 LAB — URINALYSIS, ROUTINE W REFLEX MICROSCOPIC
BILIRUBIN URINE: NEGATIVE
Hgb urine dipstick: NEGATIVE
KETONES UR: NEGATIVE
Leukocytes, UA: NEGATIVE
Nitrite: NEGATIVE
RBC / HPF: NONE SEEN (ref 0–?)
Specific Gravity, Urine: <=1.005 — AB (ref 1.000–1.030)
Total Protein, Urine: NEGATIVE
URINE GLUCOSE: NEGATIVE
UROBILINOGEN UA: 0.2 (ref 0.0–1.0)
pH: 7 (ref 5.0–8.0)

## 2017-10-18 LAB — HEPATIC FUNCTION PANEL
ALT: 13 U/L (ref 0–35)
AST: 18 U/L (ref 0–37)
Albumin: 4.1 g/dL (ref 3.5–5.2)
Alkaline Phosphatase: 69 U/L (ref 39–117)
BILIRUBIN DIRECT: 0.1 mg/dL (ref 0.0–0.3)
TOTAL PROTEIN: 6.7 g/dL (ref 6.0–8.3)
Total Bilirubin: 0.3 mg/dL (ref 0.2–1.2)

## 2017-10-18 LAB — BASIC METABOLIC PANEL
BUN: 12 mg/dL (ref 6–23)
CHLORIDE: 103 meq/L (ref 96–112)
CO2: 30 mEq/L (ref 19–32)
Calcium: 9.2 mg/dL (ref 8.4–10.5)
Creatinine, Ser: 0.75 mg/dL (ref 0.40–1.20)
GFR: 101.77 mL/min (ref >60.00)
Glucose, Bld: 103 mg/dL — ABNORMAL HIGH (ref 70–99)
POTASSIUM: 4.3 meq/L (ref 3.5–5.1)
Sodium: 140 mEq/L (ref 135–145)

## 2017-10-18 LAB — TSH: TSH: 2.62 u[IU]/mL (ref 0.35–4.50)

## 2017-10-18 NOTE — Patient Instructions (Signed)
Please complete lab work prior to leaving.  Continue healthy diet and regular exercise.  

## 2017-10-18 NOTE — Progress Notes (Signed)
Subjective:    Patient ID: Brianna Hampton, female    DOB: [birth year], 22 y.o.   MRN: [id number]  HPI  Brianna Hampton is a 22 yr old female who presents today for cpx.  Immunizations: td 2017, flu shot up to date Diet: healthy Exercise:  Enjoys running yoga Pap: 12/17 Vision: due Dental: up to date Wt Readings from Last 3 Encounters:  10/18/17 138 lb (62.6 kg)  04/06/17 138 lb 3.2 oz (62.7 kg)  10/07/16 142 lb 9.6 oz (64.7 kg)       Review of Systems  Constitutional: Negative for unexpected weight change.  HENT: Negative for hearing loss and rhinorrhea.   Eyes:       Some issues with distance vision  Respiratory: Negative for cough and shortness of breath.   Cardiovascular: Negative for chest pain and leg swelling.  Gastrointestinal: Negative for constipation and diarrhea.  Genitourinary:       Had period this past week, normal  Musculoskeletal: Negative for arthralgias and myalgias.  Skin: Negative for rash.  Neurological: Negative for headaches.  Hematological: Negative for adenopathy.  Psychiatric/Behavioral:       Depression/anxiety stable.         Past Medical History:  Diagnosis Date  . GERD (gastroesophageal reflux disease)   . Scoliosis      Social History   Socioeconomic History  . Marital status: Single    Spouse name: Not on file  . Number of children: Not on file  . Years of education: Not on file  . Highest education level: Not on file  Social Needs  . Financial resource strain: Not on file  . Food insecurity - worry: Not on file  . Food insecurity - inability: Not on file  . Transportation needs - medical: Not on file  . Transportation needs - non-medical: Not on file  Occupational History  . Occupation: Consulting civil engineertudent  Tobacco Use  . Smoking status: Never Smoker  . Smokeless tobacco: Never Used  Substance and Sexual Activity  . Alcohol use: Yes    Alcohol/week: 0.0 oz    Comment: rare  . Drug use: No  . Sexual activity: Not Currently    Other Topics Concern  . Not on file  Social History Narrative   Student at [employer]- studying elementary Ed   2 brothers   Enjoys running, cooking, painting, tv, reading.    1 dog lives with her parents     Past Surgical History:  Procedure Laterality Date  . RHINOPLASTY      Family History  Problem Relation Age of Onset  . Hyperlipidemia Father   . Arthritis Maternal Grandmother   . Heart disease Maternal Grandmother   . Arthritis Maternal Grandfather   . Lung cancer Maternal Grandfather   . Arthritis Paternal Grandmother   . Arthritis Paternal Grandfather   . Cancer Paternal Aunt        breast and lung    Allergies  Allergen Reactions  . Gluten Meal Diarrhea and Nausea And Vomiting  . Latex Rash    Current Outpatient Medications on File Prior to Visit  Medication Sig Dispense Refill  . escitalopram (LEXAPRO) 10 MG tablet Take 1 tablet (10 mg total) by mouth daily. 90 tablet 1   No current facility-administered medications on file prior to visit.     BP 111/65 (BP Location: Right Arm, Cuff Size: Normal)   Pulse (!) 57   Temp 97.9 F (36.6 C) (Oral)   Resp 16  Ht 5\' 5"  (1.651 m)   Wt 138 lb (62.6 kg)   LMP 10/14/2017   SpO2 100%   BMI 22.96 kg/m    Objective:   Physical Exam  Physical Exam  Constitutional: She is oriented to person, place, and time. She appears well-developed and well-nourished. No distress.  HENT:  Head: Normocephalic and atraumatic.  Right Ear: Tympanic membrane and ear canal normal.  Left Ear: Tympanic membrane and ear canal normal.  Mouth/Throat: Oropharynx is clear and moist.  Eyes: Pupils are equal, round, and reactive to light. No scleral icterus.  Neck: Normal range of motion. No thyromegaly present.  Cardiovascular: Normal rate and regular rhythm.   No murmur heard. Pulmonary/Chest: Effort normal and breath sounds normal. No respiratory distress. He has no wheezes. She has no rales. She exhibits no  tenderness.  Abdominal: Soft. Bowel sounds are normal. She exhibits no distension and no mass. There is no tenderness. There is no rebound and no guarding.  Musculoskeletal: She exhibits no edema.  Lymphadenopathy:    She has no cervical adenopathy.  Neurological: She is alert and oriented to person, place, and time. She has normal patellar reflexes. She exhibits normal muscle tone. Coordination normal.  Skin: Skin is warm and dry.  Psychiatric: She has a normal mood and affect. Her behavior is normal. Judgment and thought content normal.  Breasts: Examined lying Right: Without masses, retractions, discharge or axillary adenopathy.  Left: Without masses, retractions, discharge or axillary adenopathy.  Pelvic: deferred        Assessment & Plan:         Assessment & Plan:  Preventive care-encourage patient to continue healthy diet and regular exercise.  Immunizations reviewed and up-to-date.  Pap smear is up-to-date.   We will obtain routine lab work.

## 2017-12-25 ENCOUNTER — Other Ambulatory Visit: Payer: Self-pay | Admitting: Family

## 2018-04-29 ENCOUNTER — Encounter: Payer: Self-pay | Admitting: Family

## 2018-05-01 MED ORDER — ESCITALOPRAM OXALATE 10 MG PO TABS: 10.0000 mg | ORAL_TABLET | Freq: Every day | ORAL | 1 refills | Status: DC

## 2018-06-14 IMAGING — US US SOFT TISSUE HEAD/NECK
1 series · 14 of 25 positions shown · non-contrast
Comparison: None.

CLINICAL DATA: Goiter.  Thyromegaly on physical exam.

EXAM:
THYROID ULTRASOUND
TECHNIQUE: Ultrasound examination of the thyroid gland and adjacent soft
tissues was performed.

[Series 1: us soft tissue head/neck · 0.05mm/px · 14 of 45 slices shown]
[im 1/45]
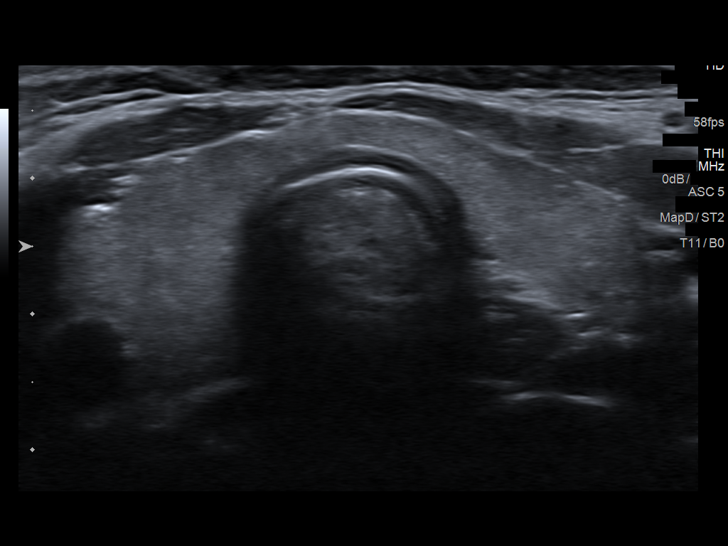
[im 4/45]
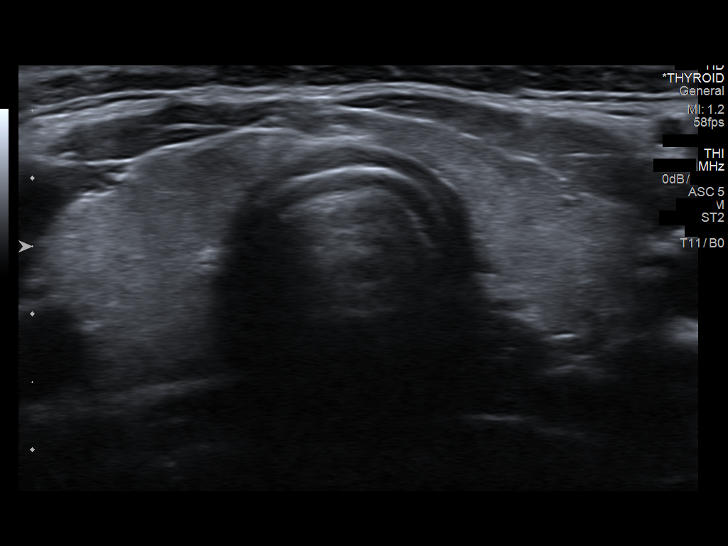
[im 8/45]
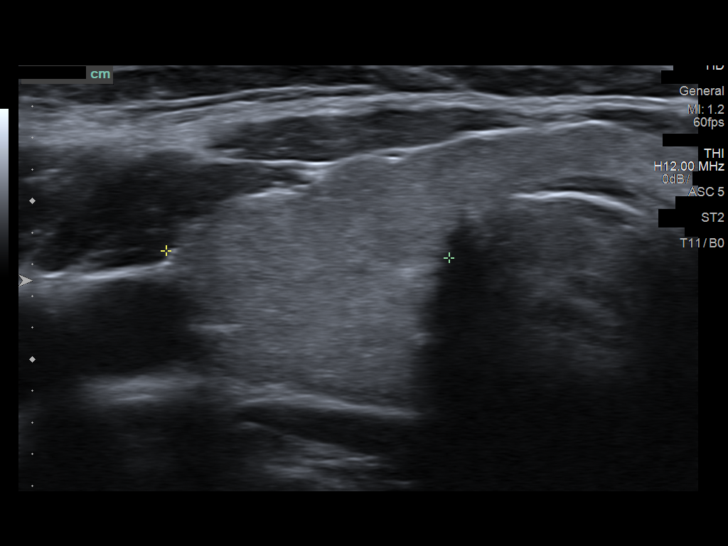
[im 12/45]
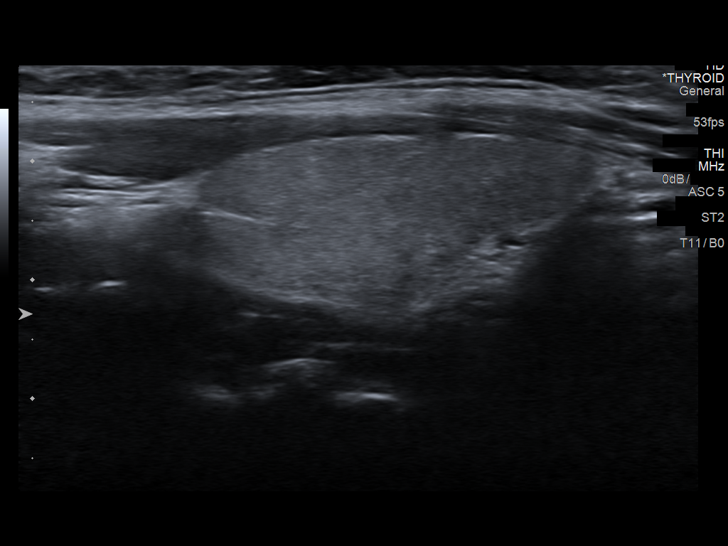
[im 15/45]
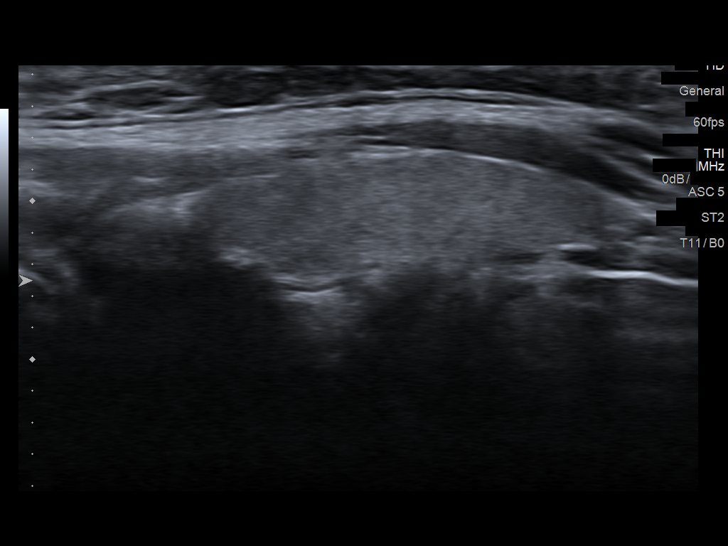
[im 17/45]
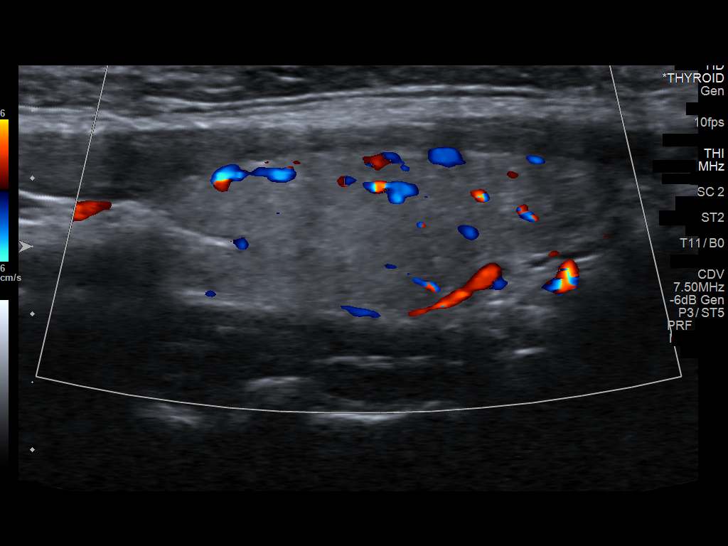
[im 21/45]
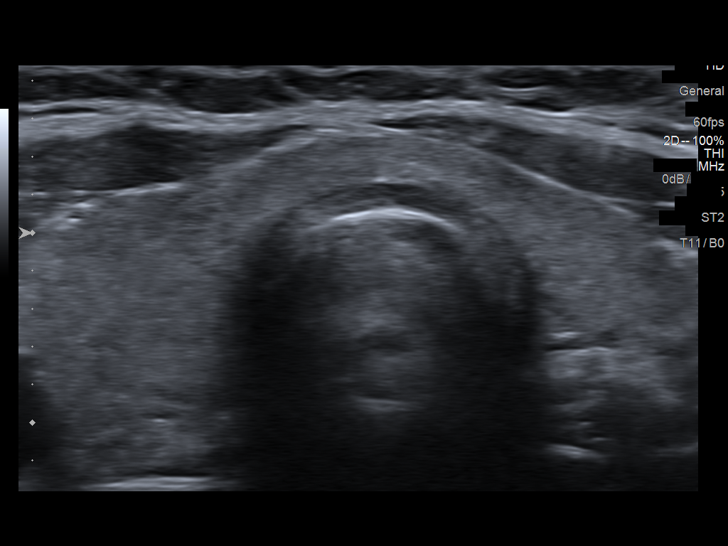
[im 24/45]
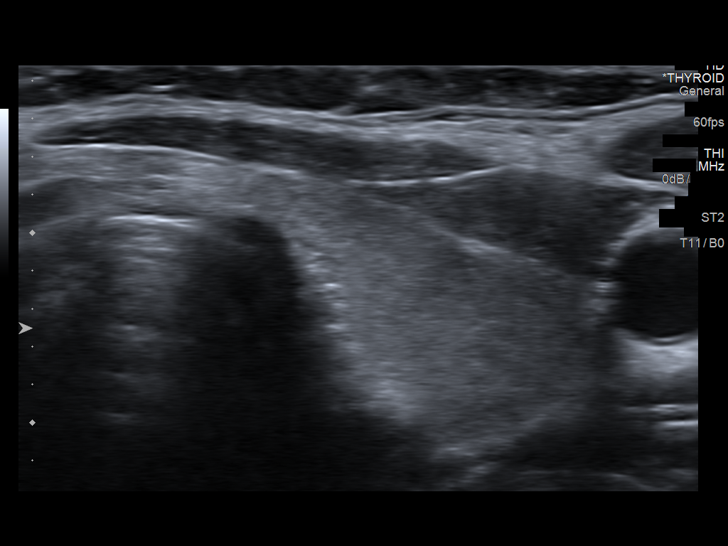
[im 28/45]
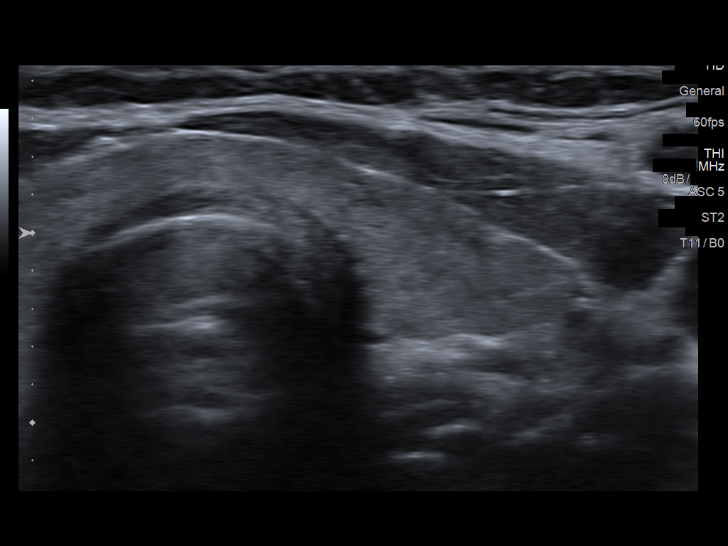
[im 30/45]
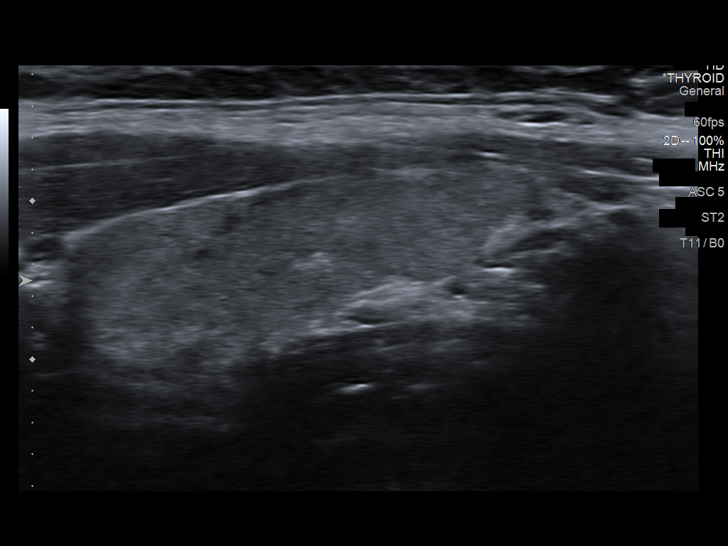
[im 34/45]
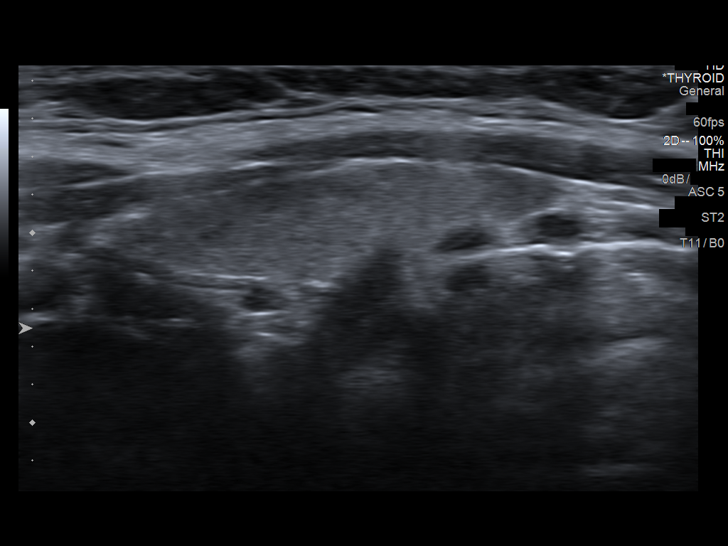
[im 37/45]
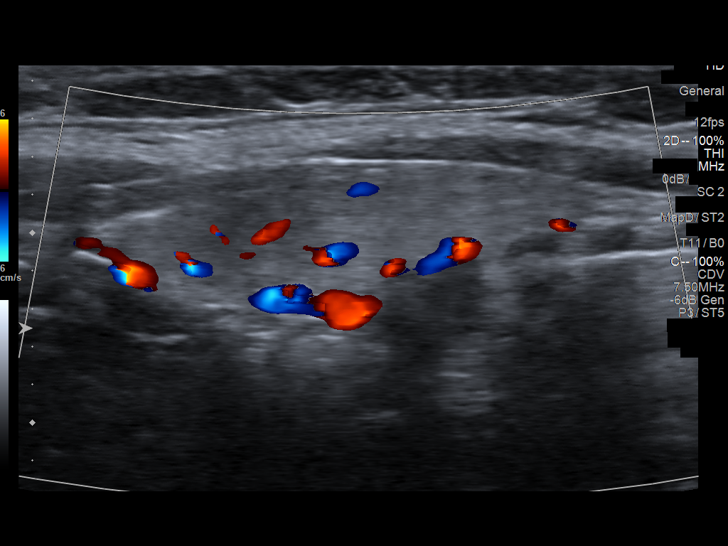
[im 41/45]
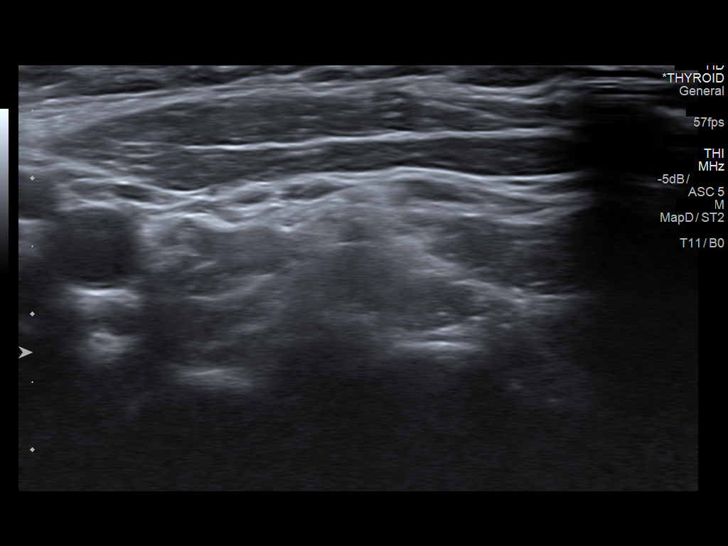
[im 45/45]
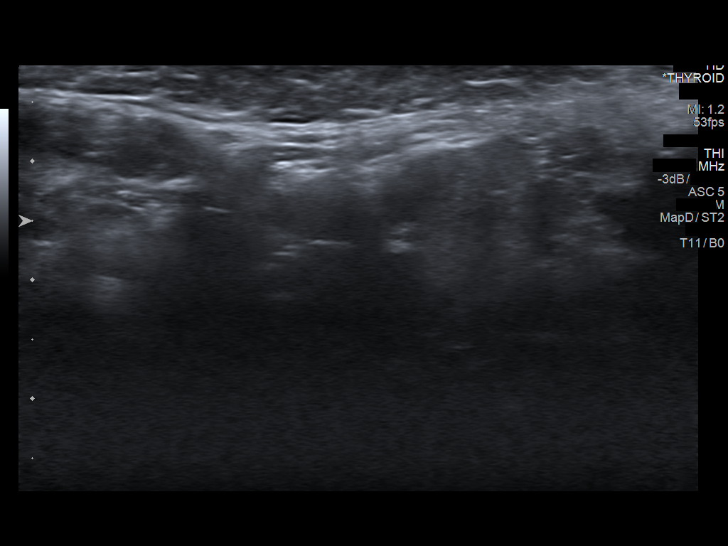

[14 of 25 positions shown; findings below may reference images not displayed]

FINDINGS: Parenchymal Echotexture: Normal

Estimated total number of nodules >/= 1 cm: 0

Number of spongiform nodules >/=  2 cm not described below (TR1): 0

Number of mixed cystic and solid nodules >/= 1.5 cm not described
below (TR2): 0

_________________________________________________________

Isthmus: 0.3 cm

No discrete nodules are identified within the thyroid isthmus.

_________________________________________________________

Right lobe: 4.8 x 1.7 x 1.8 cm

No discrete nodules are identified within the right lobe of the
thyroid.

_________________________________________________________

Left lobe: 3.7 x 1.0 x 1.5 cm

No discrete nodules are identified within the left lobe of the
thyroid.
IMPRESSION: No evidence of thyroid nodule.  The gland is normal in size.

The above is in keeping with the ACR TI-RADS recommendations - [HOSPITAL] 4912;[DATE].

## 2018-06-21 ENCOUNTER — Other Ambulatory Visit: Payer: Self-pay | Admitting: Family

## 2018-10-22 ENCOUNTER — Encounter: Payer: 59 | Admitting: Family

## 2018-10-28 ENCOUNTER — Other Ambulatory Visit: Payer: Self-pay | Admitting: Family

## 2018-11-27 ENCOUNTER — Other Ambulatory Visit: Payer: Self-pay | Admitting: Family

## 2018-12-29 ENCOUNTER — Other Ambulatory Visit: Payer: Self-pay | Admitting: Family

## 2019-04-15 ENCOUNTER — Encounter: Payer: BLUE CROSS/BLUE SHIELD | Admitting: Family

## 2019-04-30 ENCOUNTER — Ambulatory Visit: Payer: BLUE CROSS/BLUE SHIELD | Admitting: Family

## 2021-11-18 ENCOUNTER — Ambulatory Visit: Payer: Commercial Managed Care - POS | Admitting: Pediatrics

## 2021-11-18 ENCOUNTER — Ambulatory Visit: Payer: Commercial Managed Care - POS | Attending: Pediatrics

## 2021-11-18 ENCOUNTER — Encounter: Payer: Self-pay | Admitting: Pediatrics

## 2021-11-18 DIAGNOSIS — M542 Cervicalgia: Secondary | ICD-10-CM | POA: Insufficient documentation

## 2021-11-18 DIAGNOSIS — W541XXD Struck by dog, subsequent encounter: Secondary | ICD-10-CM | POA: Insufficient documentation

## 2021-11-18 DIAGNOSIS — S060X0A Concussion without loss of consciousness, initial encounter: Secondary | ICD-10-CM

## 2021-11-18 DIAGNOSIS — R42 Dizziness and giddiness: Secondary | ICD-10-CM | POA: Insufficient documentation

## 2021-11-18 DIAGNOSIS — S060X0D Concussion without loss of consciousness, subsequent encounter: Secondary | ICD-10-CM | POA: Insufficient documentation

## 2021-11-18 DIAGNOSIS — G44309 Post-traumatic headache, unspecified, not intractable: Secondary | ICD-10-CM | POA: Insufficient documentation

## 2021-11-18 DIAGNOSIS — X58XXXD Exposure to other specified factors, subsequent encounter: Secondary | ICD-10-CM | POA: Insufficient documentation

## 2021-11-18 NOTE — PT Eval Note (Cosign Needed)
[facility]  [address] [place] [address]  Phone: [phone]      Fax: [phone]    PHYSICAL THERAPY EVALUATION AND PLAN OF CARE    Chart routed electronically to referring provider for co-signature.  Augustin Coupe, NP    I agree with physical therapy plan of care.  11/18/2021  Ramonita Lab, MSN  [facility]  [facility]  [address]   [place] [address]  [phone] Fax: [phone]      PATIENT: Cynthia Conner DOB: 13-Nov-1995   MR #: [id number]  AGE: 26 y.o.    FACILITY PROVIDER #: [phone] PRIMARY MD: Pcp, None, MD    HICN# Patient is not covered by Medicare. DIAGNOSES: Concussion without loss of consciousness, sequela [S06.0X0S]      Date of Service PT Received On: 11/18/21   Treatment Time Start Time: 1300 to Stop Time: 1345   Time Calculation Time Calculation (min): 45 min   Visit #     Units Billed PT Evaluation  $ PT Evaluation Moderate Complexity (49969): 1 Procedure       CERTIFICATION DATES:   11/18/2021 - 12/13/2021  Start of Care:  11/18/2021  Follow up with Medical Provider by 12/16/2021 if not discharged before date.  Date of Injury:   October 29 2021  ED/UCC Visit?  No ED/UCC visit reported by patient.  Imaging Performed?  No imaging noted in EMR, no imaging reported by patient.    Education and Infection Prevention Regarding Covid-19 Pandemic:  Patient was educated on [employer] and Procedure regarding COVID-19 care and safety precautions. Discussed and reviewed patient's medical history and personal risk factors and patient acknowledges exposure risk and wishes to proceed with care.    [id number] Screen: Patient does not present with a temperature >/= 100.0, no new cough, no new throat pain, and no new shortness of breath.     Personal Protective Equipment Utilized:  Procedure Mask    Medications:  currently has no medications in their medication list.    Precautions:    HISTORY OF ASTHMA?  no    IMPACT TEST ON FILE?  No Baseline testing performed.    Treatment Diagnosis:    Concussion without LOC S06.0X0.D  Cervicalgia (neck pain)  M54.2  Post Traumatic Headache G44.3  Dizziness R42    Past Medical/Surgical History:  No past medical history on file.  No past surgical history on file.     RISK FACTORS THAT MAY PREDISPOSE PATIENT TO PROLONGED RECOVERY, as well as considerations for recovery:  History of depression.  History of anxiety.  Migraines - self  ADHD  History of motion sensitivity      SHORT TERM GOALS:  To be met in 4 visits.  1.  The patient will report an understanding in proper rest and nutrition strategies to assist in recovery process.  2.  The patient will report an understanding of the pathophysiology of concussion and the goals of the recovery recommendations in an effort to support compliance and understanding.    LONG TERM GOALS:  To be met in 12 visits.  3 The patient will subjective symptom free and be able to participate in a full academic day without symptoms indicating appropriate progress toward recovery  4 The patient will be free of objective symptoms indicating a return to prior level of function.  5 The patient will be taken through the return to play (RTP) protocol  successfully demonstrating recovery from injury and return to prior level of function.    HISTORY OF PRESENT INJURY:  Patient reports her dog  head butted her. She reports an immediate goose egg. On Friday, she was hit by a ladder coming out of a truck on the side of her head. Patient reports immediate headache, nausea, sensitivity to light, anxiety/depression, and disoriented. Pt did not seek medical attention and this is her first appointment regarding concussion.     SUBJECTIVE REPORT:  Pt reports some things are better but she still gets headaches when looking the screen too long, more emotional, nausea, hot flashes. 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  She is not allowed to take breaks. Pt is a runner but has not attempted due to dizziness which she describes more as lightheadedness.     Patient arrives today with:   alone.    Event Description:  Accident    Type of impact:  Head to ladder    Location of impact:  Frontal    Initial Report:  Dizziness  Return to work attempted?  Yes.   Unsuccessful    Post Concussive Symptom Scale (Pain assessment):    Score is 0 for no symptoms, and grade 1-6, with 6 being the worst.   Symptom 11/18/2021   Headache 4   Nausea 1   Vomiting    Balance problems    Dizziness 1   Lightheadedness 1   Fatigue 4   Trouble falling asleep    Sleeping more than usual    Sleeping less than usual    Drowsiness 4   Sensitivity to light 3   Sensitivity to noise 3   Irritability    Sadness 4   Nervous / Anxious 4   Feeling more emotional 4   Numbness or tingling    Feeling slowed down 2   Difficulty concentrating 1   Difficulty remembering 2   Visual problems    Other    Total Score: (MAX 132) 38     Pain:    Patient reports 3/10 HA    Patient's SUBJECTIVE symptom report most closely aligns with the following symptom trajectory pattern:  PATIENT PRIOIRTY 1 PHYSICAL Headaches Generalized pressure / pain, Dizziness, Nausea, Excessive fatigue  PATIENT PRIORITY 2 EMOTIONAL MORE EMOTIONAL - Instructed patient/family that this is a normal part of brain injury, but if it becomes severe to notify clinic, PCP or go to ED.    Observation:    Physical:  Unremarkable  Cognitive:  Unremarkable  Behavior / Mood:  Unremarkable      OBJECTIVE TESTING:  Corrective Eyewear:  No. Pt wears blue light lenses  Wearing today? No  Date of last eye exam: physical in November     The following clinical trajectories were SCREENED today to assess for immediate referrals / modifications.  TEST RESULTS COMMENTS   Cervical Exam    WNL IMP NT Date Assessed When Indicated:   Cervical AROM   [x]    []    []     Cervical Isometrics   [x]    []    []  Pain left side neck after assessment    Palpation   []    [x]    []  Tightness in suboccipitals   Sharps Purser   [x]    []    []     Alar Ligament   [x]    []    []        []    []    []   Sight and Functional Vision   Observation:  Unremarkable.     WNL IMP NT Date Assessed When Indicated:   Smooth Pursuits  Saccadic Intrusions?   [x]    []    []     EOM / ROM   [x]    []    []     Saccades - H  Hypo / Hypermetric   [x]    []    []     Saccades - V  Hypo / Hypermetric   []    [x]    []  slowed   Acuity B   [x]    []    []  10/10   Acuity R   [x]    []    []  10/10   Acuity L      [x]    []    []  10/10   NPC (4")  Insufficiency?   [x]    []    []     Accommodation R   [x]    []    []     Accommodation L     [x]    []    []     Cover  / Uncover  Phorias?  Eso / Exo   [x]    []    []        []    []    []     Visual / Vestibular    WNL IMP NT Date Assessed When Indicated:   VOR - H   []    [x]    []  Object moves   VOR - V   [x]    []    []     VOR-C   [x]    []    []     VOG   []    []    []        []    []    []     Balance    WNL IMP NT Date Assessed When Indicated:   MCTSIB - 1   [x]    []    []     MCTSIB - 2   [x]    []    []     MCTSIB - 3   [x]    []    []     MCTSIB - 4   []    [x]    []  Mild increase in sway   Gait on Level Surface       Tandem Gait   [x]    []    []     Tandem w/ Cog   [x]    []    []     Gait - H turns   [x]    []    []     Gait - V turns   [x]    []    []     Neurocom SOT     []    []    []        []    []    []     Positional Assessment   Supine  Not Assessed Today.   Standing x1'     Standing x2'     NT = not indicated for assessment, or not tested due to time constraints.      Patient Education:    Home Exercise Program issued VOR- Handout issued  Oculomotor:  Saccades - handout issued  Review of recovery protocol to maximize compliance and speed recovery.  Nutritional recommendations to support recovery - good hydration, plenty of fruits/vegetables, good quality protein snacks every 3-4 hours.  Importance of daily walks for recovery.  Education regarding cognitive restructuring of day to ensure safe, maximal  participation with daily activities.  Importance of daily routine and consistency to  decrease anxiety and cognitive complaints.  Provided resources for emotional support - given handout on local counseling resources.  Review of sleep hygiene protocol - advised to implement as much as possible to promote better sleep patterns and speed recovery.  Modifications for ocular strain - sunglasses, dim computer screens, increased font on computers, ocular rest breaks, tinted glasses as needed for screen use.  Role of the vestibular system in balance, and how it relates to impairments following concussion.  Purpose of PT treatment plan as it relates to relief of symptoms and promotion of maximal recovery.  Importance of avoiding head threatening activities to prevent further injury.    ASSESSMENT:  Cynthia Conner is a 26 y.o. female presenting to clinic today with signs and symptoms consistent with concussion.  Pt with oculomotor and vestibular impairments.  Patient requires skilled outpatient physical therapy to address these impairments and return to Her desired activities and roles.    PROGNOSIS:  Good for goals.     Recommend return to work stage: YELLOW.    Patient's OBJECTIVE findings today most closely align with the following PRIMARY clinical trajectory pattern/s:   1: Ocular:  Issued HEP - see above for details.  2: Vestibular / Balance: Gaze stability impairment noted - issued HEP, see above for details.    PLAN OF CARE:  Patient does require skilled physical therapy intervention to progress toward above stated goals.  The following interventions will be applied:    97530 Therapeutic Activities, to include cognitive restructuring of day to promote recovery, and assessment of objective impairments to determine trend toward recovery.  [id number] Therapeutic Exercises, to include home exercise program, [place] Concussion Treadmill Test and Return to Play progression.  [id number] Neuromuscular Re-education, to include balance  and ocular motor training.  [id number] Canalith Repositioning Procedures when indicated.  [id number] Manual Therapy to address cervical pain / dysfunction as well as cervicogenic contributions to symptoms when indicated.  Patient/Caregiver Education and Training  Modalities:     PT follow up 24 visits over 12 weeks to monitor recovery and provide education and therapeutic interventions as indicated to promote successful, timely recovery.  Therapeutic exercises, therapeutic activities and neuromuscular re-education to promote maximal recovery and return to prior level of function.  If patient continues to report subjective symptoms 3 weeks post-inury, recommend physiological screening and initiation of exertional therapy up to 3 times per week for 12 weeks, with home aerobic exercise program 6 days per week.  Manual Therapy and Modalities of Choice to treat cervical spine dysfunction.    NEXT VISIT:  Re-assess impaired objective findings noted above, issue HEP as indicated.  SOT next visit.    Therapist Signature:    Burnard HawthorneJamie Mixtli Reno, PT, DPT   [facility]  [email or web address]    11/18/2021      CPT Evaluation Code Justification  CRITERIA JUSTIFICATION DESCRIPTION   Personal factors and/or co-morbidities that impact the plan of care. Factors that affect plan of care include:  Anxiety, motion sensitivity  1-2 (Moderate Complexity)     Body Systems Examined Impairments noted in:  Musculoskeletal  Neuromuscular 1-2 elements (Low Complexity)     Clinical Presentation Presentation varies throughout session, days. Evolving (Moderate Complexity)     Clinical Decision Making Standardized Assessment used:  See below for details. Evaluation Code:  Moderate Complexity
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
## 2021-11-29 ENCOUNTER — Ambulatory Visit: Payer: Commercial Managed Care - POS

## 2021-11-29 DIAGNOSIS — S060X0D Concussion without loss of consciousness, subsequent encounter: Secondary | ICD-10-CM
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
## 2021-12-02 ENCOUNTER — Ambulatory Visit: Payer: Commercial Managed Care - POS | Admitting: Pediatrics

## 2021-12-02 ENCOUNTER — Ambulatory Visit: Payer: Commercial Managed Care - POS

## 2021-12-02 DIAGNOSIS — S060X0D Concussion without loss of consciousness, subsequent encounter: Secondary | ICD-10-CM
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
## 2021-12-08 ENCOUNTER — Ambulatory Visit: Payer: Commercial Managed Care - POS

## 2021-12-08 DIAGNOSIS — S060X0D Concussion without loss of consciousness, subsequent encounter: Secondary | ICD-10-CM
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
## 2021-12-15 ENCOUNTER — Ambulatory Visit: Payer: Commercial Managed Care - POS | Attending: Pediatrics

## 2021-12-15 DIAGNOSIS — G44309 Post-traumatic headache, unspecified, not intractable: Secondary | ICD-10-CM | POA: Insufficient documentation

## 2021-12-15 DIAGNOSIS — R42 Dizziness and giddiness: Secondary | ICD-10-CM | POA: Insufficient documentation

## 2021-12-15 DIAGNOSIS — M542 Cervicalgia: Secondary | ICD-10-CM | POA: Insufficient documentation

## 2021-12-15 DIAGNOSIS — W541XXD Struck by dog, subsequent encounter: Secondary | ICD-10-CM | POA: Insufficient documentation

## 2021-12-15 DIAGNOSIS — X58XXXD Exposure to other specified factors, subsequent encounter: Secondary | ICD-10-CM | POA: Insufficient documentation

## 2021-12-15 DIAGNOSIS — S060X0D Concussion without loss of consciousness, subsequent encounter: Secondary | ICD-10-CM | POA: Insufficient documentation

## 2021-12-15 NOTE — Progress Notes (Signed)
[facility]  [address] [place] [address]  Phone: [phone]      Fax: [phone]    PHYSICAL THERAPY DAILY NOTE - CONCUSSION MANAGEMENT            REFERRED BY: Augustin Coupe, NP    PATIENT: Cynthia Conner DOB: Feb 28, 1996   MR #: [id number]  AGE: 26 y.o.    FACILITY PROVIDER #: [phone] PRIMARY MD: Pcp, None, MD      Date of Service PT Received On: 12/15/21   Treatment Time Start Time: 1001 to Stop Time: 1100   Time Calculation Time Calculation (min): 59 min   Visit # PT Visit  PT Visit Number: 4/30   Units Billed   Therapeutic Interventions  $ PT Manual Therapy (97140): 1 Unit (10 mins)  $ PT Therapeutic Activity (97530): 3 units (49 mins)     Cynthia Conner referred for physical therapy services by: Augustin Coupe, NP    Certification period, precautions, medications, allergies and baseline testing status copied from initial evaluation - reviewed and reconciled today.  Emeline Darling, PT 12/15/2021  Patient reports no changes to medication.    CERTIFICATION DATES:   11/18/2021 - 12/13/2021  Start of Care:  11/18/2021  Follow up with Medical Provider by 12/16/2021 if not discharged before date.  Date of Injury:   October 29 2021  ED/UCC Visit?  No ED/UCC visit reported by patient.  Imaging Performed?  No imaging noted in EMR, no imaging reported by patient.     Education and Infection Prevention Regarding Covid-19 Pandemic:  Patient was educated on [employer] and Procedure regarding COVID-19 care and safety precautions. Discussed and reviewed patient's medical history and personal risk factors and patient acknowledges exposure risk and wishes to proceed with care.     [id number] Screen: Patient does not present with a temperature >/= 100.0, no new cough, no new throat pain, and no new shortness of breath.      Personal Protective Equipment Utilized:  Procedure Mask     Medications:  currently has no medications in their medication list.     Precautions:    HISTORY OF ASTHMA?  no     IMPACT TEST ON FILE?  No Baseline testing performed.     Treatment Diagnosis:    Concussion without LOC S06.0X0.D  Cervicalgia (neck pain)  M54.2  Post Traumatic Headache G44.3  Dizziness R42      RISK FACTORS THAT MAY PREDISPOSE PATIENT TO PROLONGED RECOVERY, as well as considerations for recovery:  History of depression.  History of anxiety.  Migraines - self  ADHD  History of motion sensitivity      SHORT TERM GOALS:  To be met in 4 visits.  1.  The patient will report an understanding in proper rest and nutrition strategies to assist in recovery process.  2.  The patient will report an understanding of the pathophysiology of concussion and the goals of the recovery recommendations in an effort to support compliance and understanding.     LONG TERM GOALS:  To be met in 12 visits.  3 The patient will subjective symptom free and be able to participate in a full academic day without symptoms indicating appropriate progress toward recovery  4 The patient will be free of objective symptoms indicating a return to prior level of function.  5 The patient will be taken through the return to play (RTP) protocol successfully demonstrating recovery from injury and return to  prior level of function.    *Working Toward the Above Goals: (copied from last visit)*    GOAL# Progress Toward Goals   1 Met   2 Met   3 Progressing   4 Progressing   5 Progressing     HISTORY OF PRESENT INJURY:  (Copied from initial evaluation) Patient reports her dog  head butted her. She reports an immediate goose egg. On Friday, she was hit by a ladder coming out of a truck on the side of her head. Patient reports immediate headache, nausea, sensitivity to light, anxiety/depression, and disoriented. Pt did not seek medical attention and this is her first appointment regarding concussion.     EXAMINATION:  Subjective:   Patient reports she had a few good days after therapy last week, but Friday she got a bad headache that  lasted until the end of the weekend, pretty intense and persistent. Got another headache last night that was much more sharp than her typical headache pain. Now back to her baseline concussion headache this morning, a little bit more sensitive to light . Mom came down to visit over the weekend and they went to lunch, noticed she has been more forgetful recently. Hasn't been running as much due to bad weather, but lifting has been making her feel good. Did try jumping type exercises (jumping jacks, jumping squats) and those felt good. Walking her dog is okay too. Screen time is still difficult, no better than last week but no worse. Still able to read her books. VOR for HEP has been going well, and SOR via tennis balls has been helping headaches.    PCSS / Pain Assessment:  Score is 0 for no symptoms, and grade 1-6, with 6 being the worst.   Symptom 12/15/2021   Headache 4   Nausea 3   Vomiting    Balance problems    Dizziness 2   Lightheadedness 2   Fatigue 4   Trouble falling asleep    Sleeping more than usual 1   Sleeping less than usual    Drowsiness 4   Sensitivity to light 2   Sensitivity to noise    Irritability 1   Sadness 2   Nervous / Anxious 2   Feeling more emotional 2   Numbness or tingling    Feeling slowed down    Difficulty concentrating 2   Difficulty remembering 4   Visual problems    Other    Total Score: (MAX 132) 35 (was 25)   PCSS is increased since last visit.  Date of last symptom:  Still reporting symptoms.    Patient's SUBJECTIVE symptom report most closely aligns with the following symptom trajectory pattern:  PATIENT PRIOIRTY 1 PHYSICAL Headaches Cervical presentation, Nausea, Photophobia  PATIENT PRIORITY 2 COGNITIVE Chief cognitive complaints: Difficulty concentrating Difficulty with recall of events Fogginess Fatigue that increases as the day goes on.  Educated on the importance of cognitive breaks during the day.  Educated on the importance of no napping - support a regular sleep/wake  cycle.  PATIENT PRIORITY 3 EMOTIONAL MORE EMOTIONAL - Instructed patient/family that this is a normal part of brain injury, but if it becomes severe to notify clinic, PCP or go to ED.  IRRITABILITY NOTED - Insructed patient/family that this is a normal part of brain injury and educated regarding going to the ED if safety for self or others is a concern.    Clinical Findings:  Corrective Eyewear:  No  Wearing today? N/A  Observation:  Unremarkable.   TEST RESULTS COMMENTS   CERVICAL EXAM    WNL WNL Prev Visit Imp NT    Cervical AROM []  [x]  []  []     Cervical Isometrics []  [x]  []  []     Palpation []  []  [x]  []  +[facility] and [facility]; improved with manual therapy    Sharps Purser []  [x]  []  []     Alar Ligament []  [x]  []  []     Cervical Kines. []  []  []  [x]     Cervical PROM []  [x]  []  []     Upper C rot'n []  [x]  []  []     Deep C flexor strength    x      SIGHT AND FUNCTIONAL VISION    WNL WN PrevVisit Imp NT    Smooth Pursuits  Saccadic? []  [x]  []  []     EOM / ROM []  [x]  []  []     Saccades  H  Hypermetric  hypometric []  [x]  []  []     Saccades  V  Hypermetric  Hypometric []  [x]  []  []     Acuity B []  [x]  []  []     Acuity R []  [x]  []  []     Acuity L    []  [x]  []  []     NPC (10"')  Insufficiency? []  [x]  []  []     Accomm  R []  [x]  []  []     Accomm  L    []  [x]  []  []     Cover / Uncover  Phorias?  Eso / Exo?      []  [x]  []  []      []  []  []  []        []  []  []  []       VISUAL / VESTIBULAR    WNL WNL Prev Visit Imp NT    VOR - H [x]  []  []  []  Reflex intact for 45 seconds in sitting; mild dizziness that resolves in less than 10 seconds; progressed to standing for 30 seconds    VOR - V []  [x]  []  []     VOR-C []  [x]  []  []     VOG []  []  []  [x]      []  []  []  []      []  []  []  []        BALANCE    WNL WNL Prev Visit Imp NT    MCTSIB - 1 []  [x]  []  []     MCTSIB - 2  []  [x]  []  []     MCTSIB - 3 []  [x]  []  []     MCTSIB - 4 []  [x]  []  []     Foam, EO with head turns []  []  []  [x]     Foam, EC with head turns []  []  []  [x]     Tandem Gait []  [x]  []  []      Tandem w/ Cog []  [x]  []  []     Gait - H turns []  [x]  []  []     Gait - V turns []  [x]  []  []     Neurocom SOT []  [x]  []  []      []  []  []  []       PHYSIOLOGICAL PERFORMANCE   Supine  Positional Testing Reveals:  Not tested today.   Standing x1'     Standing x2'     [place] [facility] TT WNL  [x]       Impaired  []     NT  []         IMPACT TESTING    WNL WNL Prev Visit Imp NT Impact Test administered to assess for symptom provocation with cognitive exertion.  ImPact Post- Injury Test []  []  []  [x]     NT = not indicated for assessment, or not tested due to time constraints.      [place] [facility] TREADMILL TEST PERFORMED to assess for appropriate physiological response and symptom provocation with physical exertion.    RISK FACTORS FOR EXERCISE:  History of Asthma?  no    STANDING Resting Blood Pressure:  124/72 HR:  79    Contraindications to exercise on same day - NOTIFY PROVIDER IF:  Resting Diastolic BP >110 or Systolic BP >200 - ABSOLUTE - DO NOT EXERCISE  Resting Diastolic BP >90 or Systolic BP >140 - RELATIVE - NEEDS MD CLEARANCE    Patient does not have contraindications to exercise today.    [facility] [facility] Treadmill Test performed for assessment of physiological performance and symptom provocation with physical exertion.    80% MHR Based on Karvonen Formula = 170  Karvonen Formula = (220-age, minus RHR, times .8, plus RHR)  Minute % Grade Speed RPE   (6-20) HR Symptoms?  HA 1/10 to start   1 0 3.3 mph 7 105    2 0 3.3 mph 8 109    3 1 3.3 mph 8 114    4 2 3.3 mph 10 117    5 3 3.3 mph 11 122    6 4 3.3 mph 12 125    7 5 3.3 mph 12 127    8 6 3.3 mph 13 134    9 7 3.3 mph 14 139    10 8 3.3 mph 14 142    11 9 3.3 mph 15 152    12 10 3.3 mph 15 154    13 11 3.3 mph 15 163    14 12 3.3 mph 15 168    15 13 3.3 mph 15 174      *If patient is an athlete and has not reached 80% of MHR by 15% incline, increase pace by 0.36mph every minute until 80% MHR is achieved.    Cool down was performed.    Post-Exercise - STANDING  BP HR   3 minute/s 131/81 120     Patient DID pass the treadmill test - no significant change in subjective symptom report and physiological response is within normal ranges.    ASSESSMENTS / INTERVENTION:   THERAPEUTIC ACTIVITY Re-assessment of objective impairments noted last visit performed - see above for details.  Review of daily nutrition log and educated on strategies to promote recovery.  Review of day and provided modification strategies to promote successful participation in non-physical daily activities.  Visual / Vestibular Training: VOR x 1 in sitting and standing with emphasis on horizontal head shakes, parameters noted above  [place] [facility] Treadmill Test administered.  Update/review of HEP    MANUAL THERAPY   SOR; occipital distraction; STM to upper traps, levator scap, paraspinals, scalenes, SCM; C1 rotational mobilizations    Patient Education:  Home Exercise Program issued VOR- Handout issued  Cervical Stretches - handout issued  Patient educated in postural re-education to prevent pain and promote alignment.  Issued home program of RTP-2 with patient working towards 20-25 minutes of aerobic activity daily.  Review of recovery protocol to maximize compliance and speed recovery.  Nutritional recommendations to support recovery - good hydration, plenty of Conner/vegetables, good quality protein snacks every 3-4 hours.  Importance of daily walks for recovery.  Education regarding cognitive restructuring of day to ensure safe, maximal participation with daily activities.  Importance  of daily routine and consistency to decrease anxiety and cognitive complaints.  Modifications for ocular strain - sunglasses, dim computer screens, increased font on computers, ocular rest breaks, tinted glasses as needed for screen use.  Role of the vestibular system in balance, and how it relates to impairments following concussion.  Physiological changes following concussion that could contribute to  symptoms.  Purpose of PT treatment plan as it relates to relief of symptoms and promotion of maximal recovery.  Patient educated on RTP protocol and how to progress safely through stages.  Importance of avoiding head threatening activities to prevent further injury.    EVALUATION AND DIAGNOSIS:   Cynthia Conner is a 26 y.o. female presenting to clinic today with resolving symptoms of concussion. Patient passed TMT today with appropriate HR response and no significant change in symptoms. Did not that neck stiffened more towards the end of the test, so performed manual therapy as noted above which helped ease any headache/tightness she was starting to develop. Progressed VOR to standing and encouraged her to slowly progress her running as tolerate due to normal physiologic response today. Also discussed working on posture during running to avoid keeping tension in her neck/shoulders which may also be contributing to her headaches. Patient would continue to benefit from skilled PT in order to address impairments and return to PLOF.      Recommend return to work stage: RED - per medical provider recommendation.     Patient's OBJECTIVE findings today most closely align with the following clinical trajectory pattern:  1: Cervical:  Wait one week and reassess for recovery., Monitor for recovery and intervene as indicated., Educated on cervical stretches / massage., Educated on proper posture and its effect on pain / function., Consider cervical strengthening exercises.  2: Vestibular / Balance: Wait one week and reassess for recovery.  Space in motion sensitivity noted - issued HEP, see above for details.    Functional Limitations include:  Patient is unable to participate in a full work day at this time secondary to patient comfort and / or safety.  Patient is unable to participate in any physical activities at this time.     Disabilities:  Patient will be unable to fulfill work duties at prior level without recovery from  injury.  Patient is unable to resume safe participation in physical activities until recovery from injury.  Patient is at risk for further / permanent impairment if not fully recovered from injury.     PLAN:  PROGRESS TOWARD DISCHARGE CRITERIA   Symptom free at rest, or at previous subjective symptom baseline. No   Symptom free with cognitive exertion.   No  Patient has not successfully completed full day of non-physical work duties without increase in symptoms.   Successfully completed at least 24 hours on the GREEN stage at school or work. No   Normalized objective findings. No  See above objective testing notes for details.   Completed the RTP-4 readiness checklist (see scanned checklist in chart) and is ready to advance to RTP-4. No, patient is not ready to advance to RTP-4.     Patient has not successfully completed all required steps above for discharge from physical therapy.    Patient does require continued skilled physical therapy intervention to progress toward above stated goals.      Recommendations:   Continue with established plan of care.    NEXT VISIT:  Re-assess impaired objective findings noted above, issue HEP as indicated.  Assess primary symptom complex and  intervene as indicated.  Cervical kinesthesia next visit.    Follow up with medical provider by 12/30/21.    Therapist Signature:    Emeline Darling PT, DPT    12/15/2021

## 2021-12-21 ENCOUNTER — Ambulatory Visit: Payer: Commercial Managed Care - POS

## 2021-12-21 DIAGNOSIS — S060X0D Concussion without loss of consciousness, subsequent encounter: Secondary | ICD-10-CM

## 2021-12-21 NOTE — Progress Notes (Signed)
[facility]  [address] [place] [address]  Phone: [phone]      Fax: [phone]    PHYSICAL THERAPY DAILY NOTE - CONCUSSION MANAGEMENT            REFERRED BY: Augustin Coupe, NP    PATIENT: Cynthia Conner DOB: Jan 14, 1996   MR #: [id number]  AGE: 26 y.o.    FACILITY PROVIDER #: [phone] PRIMARY MD: Pcp, None, MD      Date of Service PT Received On: 12/21/21   Treatment Time Start Time: 0905 to Stop Time: 0945   Time Calculation Time Calculation (min): 40 min   Visit # PT Visit  PT Visit Number: 5/30   Units Billed   Therapeutic Interventions  $ PT Manual Therapy (97140): 1 Unit  $ PT Neuromuscular Re-education [phone]): 2 Units     Cynthia Conner referred for physical therapy services by: Augustin Coupe, NP    Certification period, precautions, medications, allergies and baseline testing status copied from initial evaluation - reviewed and reconciled today.  Kae Heller, PT 12/21/2021  Patient reports no changes to medication.    CERTIFICATION DATES:   11/18/2021 - 12/13/2021  Start of Care:  11/18/2021  Follow up with Medical Provider by 12/16/2021 if not discharged before date.  Date of Injury:   October 29 2021  ED/UCC Visit?  No ED/UCC visit reported by patient.  Imaging Performed?  No imaging noted in EMR, no imaging reported by patient.     Education and Infection Prevention Regarding Covid-19 Pandemic:  Patient was educated on [employer] and Procedure regarding COVID-19 care and safety precautions. Discussed and reviewed patient's medical history and personal risk factors and patient acknowledges exposure risk and wishes to proceed with care.     [id number] Screen: Patient does not present with a temperature >/= 100.0, no new cough, no new throat pain, and no new shortness of breath.      Personal Protective Equipment Utilized:  Procedure Mask     Medications:  currently has no medications in their medication list.     Precautions:   HISTORY OF ASTHMA?   no     IMPACT TEST ON FILE?  No Baseline testing performed.     Treatment Diagnosis:    Concussion without LOC S06.0X0.D  Cervicalgia (neck pain)  M54.2  Post Traumatic Headache G44.3  Dizziness R42      RISK FACTORS THAT MAY PREDISPOSE PATIENT TO PROLONGED RECOVERY, as well as considerations for recovery:  History of depression.  History of anxiety.  Migraines - self  ADHD  History of motion sensitivity      SHORT TERM GOALS:  To be met in 4 visits.  1.  The patient will report an understanding in proper rest and nutrition strategies to assist in recovery process.  2.  The patient will report an understanding of the pathophysiology of concussion and the goals of the recovery recommendations in an effort to support compliance and understanding.     LONG TERM GOALS:  To be met in 12 visits.  3 The patient will subjective symptom free and be able to participate in a full academic day without symptoms indicating appropriate progress toward recovery  4 The patient will be free of objective symptoms indicating a return to prior level of function.  5 The patient will be taken through the return to play (RTP) protocol successfully demonstrating recovery from injury and return to prior level of  function.    *Working Toward the Above Goals: (copied from last visit)*    GOAL# Progress Toward Goals   1 Met   2 Met   3 Progressing   4 Progressing   5 Progressing     HISTORY OF PRESENT INJURY:  (Copied from initial evaluation) Patient reports her dog  head butted her. She reports an immediate goose egg. On Friday, she was hit by a ladder coming out of a truck on the side of her head. Patient reports immediate headache, nausea, sensitivity to light, anxiety/depression, and disoriented. Pt did not seek medical attention and this is her first appointment regarding concussion.     EXAMINATION:  Subjective:   Patient reports she feels 80% recovered from concussion. She states she feels she has difficulty getting settled at night and  has been having difficulty falling asleep and waking up periodically. She went on a run and did some journaling. 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 She is still taking frequent breaks with screen time. Pt reports her headaches are better and can do things without intense pain and only had a dull headache with running.       PCSS / Pain Assessment:  Score is 0 for no symptoms, and grade 1-6, with 6 being the worst.   Symptom 12/21/2021   Headache 1   Nausea 1   Vomiting    Balance problems    Dizziness 2   Lightheadedness 2   Fatigue 3   Trouble falling asleep 4   Sleeping more than usual 2   Sleeping less than usual 2   Drowsiness 2   Sensitivity to light    Sensitivity to noise    Irritability    Sadness 1   Nervous / Anxious 3   Feeling more emotional 1   Numbness or tingling    Feeling slowed down    Difficulty concentrating    Difficulty remembering 2   Visual problems    Other    Total Score: (MAX 132) 26   PCSS is increased since last visit.  Date of last symptom:  Still reporting symptoms.    Patient's SUBJECTIVE symptom report most closely aligns with the following symptom trajectory pattern:  PATIENT PRIOIRTY 1 PHYSICAL Headaches Cervical presentation, Nausea, Photophobia  PATIENT PRIORITY 2 COGNITIVE Chief cognitive complaints: Difficulty concentrating Difficulty with recall of events Fogginess Fatigue that increases as the day goes on.  Educated on the importance of cognitive breaks during the day.  Educated on the importance of no napping - support a regular sleep/wake cycle.  PATIENT PRIORITY 3 EMOTIONAL MORE EMOTIONAL - Instructed patient/family that this is a normal part of brain injury, but if it becomes severe to notify clinic, PCP or go to ED.  IRRITABILITY NOTED - Insructed patient/family that this is a normal part of brain injury and educated regarding going to the ED if safety for self or others is a concern.    Clinical  Findings:  Corrective Eyewear:  No  Wearing today? N/A    Observation:  Unremarkable.   TEST RESULTS COMMENTS   CERVICAL EXAM    WNL WNL Prev Visit Imp NT    Cervical AROM []  [x]  []  []     Cervical Isometrics []  [x]  []  []     Palpation []  []  [x]  []  +TTP and muscle tightness noted to [facility]; improved with manual therapy    Sharps Purser []  [  x] []  []     Alar Ligament []  [x]  []  []     Cervical Kines. []  []  []  [x]     Cervical PROM []  [x]  []  []     Upper C rot'n []  [x]  []  []     Deep C flexor strength    x      SIGHT AND FUNCTIONAL VISION    WNL WN PrevVisit Imp NT    Smooth Pursuits  Saccadic? []  [x]  []  []     EOM / ROM []  [x]  []  []     Saccades  H  Hypermetric  hypometric []  [x]  []  []     Saccades  V  Hypermetric  Hypometric []  [x]  []  []     Acuity B []  [x]  []  []     Acuity R []  [x]  []  []     Acuity L    []  [x]  []  []     NPC (10"')  Insufficiency? []  [x]  []  []     Accomm  R []  [x]  []  []     Accomm  L    []  [x]  []  []     Cover / Uncover  Phorias?  Eso / Exo?      []  [x]  []  []      []  []  []  []        []  []  []  []       VISUAL / VESTIBULAR    WNL WNL Prev Visit Imp NT    VOR - H [x]  []  []  []  Patient progressed to higher speeds in standing and walking with head turns which caused mild headache    VOR - V []  [x]  []  []     VOR-C []  [x]  []  []     VOG []  []  []  [x]      []  []  []  []      []  []  []  []        BALANCE    WNL WNL Prev Visit Imp NT    MCTSIB - 1 []  [x]  []  []     MCTSIB - 2  []  [x]  []  []     MCTSIB - 3 []  [x]  []  []     MCTSIB - 4 []  [x]  []  []     Foam, EO with head turns []  []  []  [x]     Foam, EC with head turns []  []  []  [x]     Tandem Gait []  [x]  []  []     Tandem w/ Cog []  [x]  []  []     Gait - H turns []  [x]  []  []     Gait - V turns []  [x]  []  []     Neurocom SOT []  [x]  []  []      []  []  []  []       PHYSIOLOGICAL PERFORMANCE   Supine  Positional Testing Reveals:  Not tested today.   Standing x1'     Standing x2'     [place] Concussion TT WNL  [x]       Impaired  []     NT  []         IMPACT TESTING    WNL WNL Prev Visit Imp NT Impact Test administered to assess  for symptom provocation with cognitive exertion.   ImPact Post- Injury Test []  []  []  [x]     NT = not indicated for assessment, or not tested due to time constraints.      [place] CONCUSSION TREADMILL TEST PERFORMED to assess for appropriate physiological response and symptom provocation with physical exertion.    RISK FACTORS FOR EXERCISE:  History of Asthma?  no    STANDING Resting Blood Pressure:  124/72 HR:  79  Contraindications to exercise on same day - NOTIFY PROVIDER IF:  Resting Diastolic BP >110 or Systolic BP >200 - ABSOLUTE - DO NOT EXERCISE  Resting Diastolic BP >90 or Systolic BP >140 - RELATIVE - NEEDS MD CLEARANCE    Patient does not have contraindications to exercise today.    [place] Concussion Treadmill Test performed for assessment of physiological performance and symptom provocation with physical exertion.    80% MHR Based on Karvonen Formula = 170  Karvonen Formula = (220-age, minus RHR, times .8, plus RHR)  Minute % Grade Speed RPE   (6-20) HR Symptoms?  HA 1/10 to start   1 0 3.3 mph 7 105    2 0 3.3 mph 8 109    3 1 3.3 mph 8 114    4 2 3.3 mph 10 117    5 3 3.3 mph 11 122    6 4 3.3 mph 12 125    7 5 3.3 mph 12 127    8 6 3.3 mph 13 134    9 7 3.3 mph 14 139    10 8 3.3 mph 14 142    11 9 3.3 mph 15 152    12 10 3.3 mph 15 154    13 11 3.3 mph 15 163    14 12 3.3 mph 15 168    15 13 3.3 mph 15 174      *If patient is an athlete and has not reached 80% of MHR by 15% incline, increase pace by 0.40mph every minute until 80% MHR is achieved.    Cool down was performed.    Post-Exercise - STANDING BP HR   3 minute/s 131/81 120     Patient DID pass the treadmill test - no significant change in subjective symptom report and physiological response is within normal ranges.    ASSESSMENTS / INTERVENTION:   THERAPEUTIC ACTIVITY Re-assessment of objective impairments noted last visit performed - see above for details.  Review of daily nutrition log and educated on strategies to promote  recovery.  Review of day and provided modification strategies to promote successful participation in non-physical daily activities.  Visual / Vestibular Training: VOR x 1 in sitting and standing with emphasis on horizontal head shakes, parameters noted above  Update/review of HEP    MANUAL THERAPY   SOR; occipital distraction; STM to upper traps, levator scap, paraspinals, scalenes, SCM    Patient Education:  Home Exercise Program issued VOR- Handout issued  Cervical Stretches - handout issued  Patient educated in postural re-education to prevent pain and promote alignment.  Issued home program of RTP-2 with patient working towards 20-25 minutes of aerobic activity daily.  Review of recovery protocol to maximize compliance and speed recovery.  Nutritional recommendations to support recovery - good hydration, plenty of Conner/vegetables, good quality protein snacks every 3-4 hours.  Importance of daily walks for recovery.  Education regarding cognitive restructuring of day to ensure safe, maximal participation with daily activities.  Importance of daily routine and consistency to decrease anxiety and cognitive complaints.  Modifications for ocular strain - sunglasses, dim computer screens, increased font on computers, ocular rest breaks, tinted glasses as needed for screen use.  Role of the vestibular system in balance, and how it relates to impairments following concussion.  Physiological changes following concussion that could contribute to symptoms.  Purpose of PT treatment plan as it relates to relief of symptoms and promotion of maximal recovery.  Patient educated on  RTP protocol and how to progress safely through stages.  Importance of avoiding head threatening activities to prevent further injury.    EVALUATION AND DIAGNOSIS:   Elanda Garmany is a 26 y.o. female presenting to clinic today with resolving symptoms of concussion. Patient with improvement in neck tightness and symptoms after manual therapy.  Progressed VOR to standing and instructed patient to start walking with VOR exercise as soon as able to perform standing without symptoms. Patient would continue to benefit from skilled PT in order to address impairments and return to PLOF.      Recommend return to work stage: RED - per medical provider recommendation.     Patient's OBJECTIVE findings today most closely align with the following clinical trajectory pattern:  1: Cervical:  Wait one week and reassess for recovery., Monitor for recovery and intervene as indicated., Educated on cervical stretches / massage., Educated on proper posture and its effect on pain / function., Consider cervical strengthening exercises.  2: Vestibular / Balance: Wait one week and reassess for recovery.  Space in motion sensitivity noted - issued HEP, see above for details.    Functional Limitations include:  Patient is unable to participate in a full work day at this time secondary to patient comfort and / or safety.  Patient is unable to participate in any physical activities at this time.     Disabilities:  Patient will be unable to fulfill work duties at prior level without recovery from injury.  Patient is unable to resume safe participation in physical activities until recovery from injury.  Patient is at risk for further / permanent impairment if not fully recovered from injury.     PLAN:  PROGRESS TOWARD DISCHARGE CRITERIA   Symptom free at rest, or at previous subjective symptom baseline. No   Symptom free with cognitive exertion.   No  Patient has not successfully completed full day of non-physical work duties without increase in symptoms.   Successfully completed at least 24 hours on the GREEN stage at school or work. No   Normalized objective findings. No  See above objective testing notes for details.   Completed the RTP-4 readiness checklist (see scanned checklist in chart) and is ready to advance to RTP-4. No, patient is not ready to advance to RTP-4.     Patient  has not successfully completed all required steps above for discharge from physical therapy.    Patient does require continued skilled physical therapy intervention to progress toward above stated goals.      Recommendations:   Continue with established plan of care.    NEXT VISIT:  Re-assess impaired objective findings noted above, issue HEP as indicated.  Assess primary symptom complex and intervene as indicated.  Cervical kinesthesia next visit if needed    Follow up with medical provider by 12/30/21.    Therapist Signature:    Burnard Hawthorne, DPT, PT  12/21/2021
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
## 2021-12-29 ENCOUNTER — Ambulatory Visit: Payer: Commercial Managed Care - POS | Admitting: Pediatrics

## 2021-12-29 ENCOUNTER — Ambulatory Visit: Payer: Commercial Managed Care - POS

## 2021-12-29 DIAGNOSIS — S060X0D Concussion without loss of consciousness, subsequent encounter: Secondary | ICD-10-CM

## 2021-12-29 NOTE — Progress Notes (Signed)
[facility]  [address] [place] [address]  Phone: [phone]      Fax: [phone]    PHYSICAL THERAPY DAILY NOTE - CONCUSSION MANAGEMENT            REFERRED BY: Augustin Coupe, NP    PATIENT: Cynthia Conner DOB: 02-10-1996   MR #: [id number]  AGE: 26 y.o.    FACILITY PROVIDER #: [phone] PRIMARY MD: Pcp, None, MD      Date of Service PT Received On: 12/29/21   Treatment Time Start Time: 1000 to Stop Time: 1030   Time Calculation Time Calculation (min): 30 min   Visit #     Units Billed   Therapeutic Interventions  $ PT Manual Therapy (18841): 1 Unit  $ PT Neuromuscular Re-education [phone]): 1 Unit     [place] referred for physical therapy services by: Augustin Coupe, NP    Certification period, precautions, medications, allergies and baseline testing status copied from initial evaluation - reviewed and reconciled today.  Kae Heller, PT 12/29/2021  Patient reports no changes to medication.    CERTIFICATION DATES:   11/18/2021 - 02/10/2022  Start of Care:  11/18/2021  Follow up with Medical Provider by 12/16/2021 if not discharged before date.  Date of Injury:   October 29 2021  ED/UCC Visit?  No ED/UCC visit reported by patient.  Imaging Performed?  No imaging noted in EMR, no imaging reported by patient.     Education and Infection Prevention Regarding Covid-19 Pandemic:  Patient was educated on [employer] and Procedure regarding COVID-19 care and safety precautions. Discussed and reviewed patient's medical history and personal risk factors and patient acknowledges exposure risk and wishes to proceed with care.     [id number] Screen: Patient does not present with a temperature >/= 100.0, no new cough, no new throat pain, and no new shortness of breath.      Personal Protective Equipment Utilized:  Procedure Mask     Medications:  currently has no medications in their medication list.     Precautions:   HISTORY OF ASTHMA?  no     IMPACT TEST ON FILE?   No Baseline testing performed.     Treatment Diagnosis:    Concussion without LOC S06.0X0.D  Cervicalgia (neck pain)  M54.2  Post Traumatic Headache G44.3  Dizziness R42      RISK FACTORS THAT MAY PREDISPOSE PATIENT TO PROLONGED RECOVERY, as well as considerations for recovery:  History of depression.  History of anxiety.  Migraines - self  ADHD  History of motion sensitivity      SHORT TERM GOALS:  To be met in 4 visits.  1.  The patient will report an understanding in proper rest and nutrition strategies to assist in recovery process.  2.  The patient will report an understanding of the pathophysiology of concussion and the goals of the recovery recommendations in an effort to support compliance and understanding.     LONG TERM GOALS:  To be met in 12 visits.  3 The patient will subjective symptom free and be able to participate in a full academic day without symptoms indicating appropriate progress toward recovery  4 The patient will be free of objective symptoms indicating a return to prior level of function.  5 The patient will be taken through the return to play (RTP) protocol successfully demonstrating recovery from injury and return to prior level of function.    *Working  Toward the Above Goals: (copied from last visit)*    GOAL# Progress Toward Goals   1 Met   2 Met   3 Progressing   4 Progressing   5 Progressing     HISTORY OF PRESENT INJURY:  (Copied from initial evaluation) Patient reports her dog  head butted her. She reports an immediate goose egg. On Friday, she was hit by a ladder coming out of a truck on the side of her head. Patient reports immediate headache, nausea, sensitivity to light, anxiety/depression, and disoriented. Pt did not seek medical attention and this is her first appointment regarding concussion.     EXAMINATION:  Subjective:   Patient reports she feels 85% recovered from concussion. She states she feels the rest is in busy environments where she has difficulty but feels fine at  home. Pt is exercising but hasn't tried running yet due to the weather. She is doing yoga and youtube videos. She reports some difficulty with short term memory.     PCSS / Pain Assessment:  Score is 0 for no symptoms, and grade 1-6, with 6 being the worst.   Symptom 12/29/2021   Headache 1   Nausea    Vomiting    Balance problems    Dizziness 2   Lightheadedness 2   Fatigue 1   Trouble falling asleep 1   Sleeping more than usual    Sleeping less than usual    Drowsiness 1   Sensitivity to light    Sensitivity to noise    Irritability 1   Sadness 2   Nervous / Anxious 2   Feeling more emotional 2   Numbness or tingling    Feeling slowed down    Difficulty concentrating    Difficulty remembering 2   Visual problems    Other    Total Score: (MAX 132) 17   PCSS is increased since last visit.  Date of last symptom:  Still reporting symptoms.    Patient's SUBJECTIVE symptom report most closely aligns with the following symptom trajectory pattern:  PATIENT PRIOIRTY 1 PHYSICAL Headaches Cervical presentation, Nausea, Photophobia  PATIENT PRIORITY 2 COGNITIVE Chief cognitive complaints: Difficulty concentrating Difficulty with recall of events Fogginess Fatigue that increases as the day goes on.  Educated on the importance of cognitive breaks during the day.  Educated on the importance of no napping - support a regular sleep/wake cycle.  PATIENT PRIORITY 3 EMOTIONAL MORE EMOTIONAL - Instructed patient/family that this is a normal part of brain injury, but if it becomes severe to notify clinic, PCP or go to ED.  IRRITABILITY NOTED - Insructed patient/family that this is a normal part of brain injury and educated regarding going to the ED if safety for self or others is a concern.    Clinical Findings:  Corrective Eyewear:  No  Wearing today? N/A    Observation:  Unremarkable.   TEST RESULTS COMMENTS   CERVICAL EXAM    WNL WNL Prev Visit Imp NT    Cervical AROM []  [x]  []  []     Cervical Isometrics []  [x]  []  []     Palpation []   []  [x]  []  +TTP and muscle tightness noted to [facility]; improved with manual therapy    Sharps Purser []  [x]  []  []     Alar Ligament []  [x]  []  []     Cervical Kines. []  []  []  [x]     Cervical PROM []  [x]  []  []     Upper C rot'n []  [x]  []  []   Deep C flexor strength    x      SIGHT AND FUNCTIONAL VISION    WNL WN PrevVisit Imp NT    Smooth Pursuits  Saccadic? []  [x]  []  []     EOM / ROM []  [x]  []  []     Saccades  H  Hypermetric  hypometric []  [x]  []  []     Saccades  V  Hypermetric  Hypometric []  [x]  []  []     Acuity B []  [x]  []  []     Acuity R []  [x]  []  []     Acuity L    []  [x]  []  []     NPC (10"')  Insufficiency? []  [x]  []  []     Accomm  R []  [x]  []  []     Accomm  L    []  [x]  []  []     Cover / Uncover  Phorias?  Eso / Exo?      []  [x]  []  []      []  []  []  []        []  []  []  []       VISUAL / VESTIBULAR    WNL WNL Prev Visit Imp NT    VOR - H [x]  []  []  []  Patient progressed to walking with head turns    VOR - V []  [x]  []  []     VOR-C []  [x]  []  []     VOG []  []  []  [x]      []  []  []  []      []  []  []  []        BALANCE    WNL WNL Prev Visit Imp NT    MCTSIB - 1 []  [x]  []  []     MCTSIB - 2  []  [x]  []  []     MCTSIB - 3 []  [x]  []  []     MCTSIB - 4 []  [x]  []  []     Foam, EO with head turns []  []  []  [x]     Foam, EC with head turns []  []  []  [x]     Tandem Gait []  [x]  []  []     Tandem w/ Cog []  [x]  []  []     Gait - H turns []  [x]  []  []     Gait - V turns []  [x]  []  []     Neurocom SOT []  [x]  []  []      []  []  []  []       PHYSIOLOGICAL PERFORMANCE   Supine  Positional Testing Reveals:  Not tested today.   Standing x1'     Standing x2'     [place] Concussion TT WNL  [x]       Impaired  []     NT  []         IMPACT TESTING    WNL WNL Prev Visit Imp NT Impact Test administered to assess for symptom provocation with cognitive exertion.   ImPact Post- Injury Test []  []  []  [x]     NT = not indicated for assessment, or not tested due to time constraints.      [place] CONCUSSION TREADMILL TEST PERFORMED to assess for appropriate physiological response and symptom provocation with  physical exertion.    RISK FACTORS FOR EXERCISE:  History of Asthma?  no    STANDING Resting Blood Pressure:  124/72 HR:  79    Contraindications to exercise on same day - NOTIFY PROVIDER IF:  Resting Diastolic BP >110 or Systolic BP >200 - ABSOLUTE - DO NOT EXERCISE  Resting Diastolic BP >90 or Systolic BP >140 - RELATIVE - NEEDS MD CLEARANCE    Patient does not have contraindications to exercise  today.    [place] Concussion Treadmill Test performed for assessment of physiological performance and symptom provocation with physical exertion.    80% MHR Based on Karvonen Formula = 170  Karvonen Formula = (220-age, minus RHR, times .8, plus RHR)  Minute % Grade Speed RPE   (6-20) HR Symptoms?  HA 1/10 to start   1 0 3.3 mph 7 105    2 0 3.3 mph 8 109    3 1 3.3 mph 8 114    4 2 3.3 mph 10 117    5 3 3.3 mph 11 122    6 4 3.3 mph 12 125    7 5 3.3 mph 12 127    8 6 3.3 mph 13 134    9 7 3.3 mph 14 139    10 8 3.3 mph 14 142    11 9 3.3 mph 15 152    12 10 3.3 mph 15 154    13 11 3.3 mph 15 163    14 12 3.3 mph 15 168    15 13 3.3 mph 15 174      *If patient is an athlete and has not reached 80% of MHR by 15% incline, increase pace by 0.49mph every minute until 80% MHR is achieved.    Cool down was performed.    Post-Exercise - STANDING BP HR   3 minute/s 131/81 120     Patient DID pass the treadmill test - no significant change in subjective symptom report and physiological response is within normal ranges.    ASSESSMENTS / INTERVENTION:   THERAPEUTIC ACTIVITY Re-assessment of objective impairments noted last visit performed - see above for details.  Review of daily nutrition log and educated on strategies to promote recovery.  Review of day and provided modification strategies to promote successful participation in non-physical daily activities.  Visual / Vestibular Training: VOR x 1 walking on blank wall with emphasis on horizontal head shakes, parameters noted above  Update/review of HEP    MANUAL THERAPY   SOR;  occipital distraction; STM to upper traps, levator scap, paraspinals, scalenes, SCM    Patient Education:  Home Exercise Program issued VOR- Handout issued  Cervical Stretches - handout issued  Patient educated in postural re-education to prevent pain and promote alignment.  Issued home program of RTP-2 with patient working towards 20-25 minutes of aerobic activity daily.  Review of recovery protocol to maximize compliance and speed recovery.  Nutritional recommendations to support recovery - good hydration, plenty of Conner/vegetables, good quality protein snacks every 3-4 hours.  Importance of daily walks for recovery.  Education regarding cognitive restructuring of day to ensure safe, maximal participation with daily activities.  Importance of daily routine and consistency to decrease anxiety and cognitive complaints.  Modifications for ocular strain - sunglasses, dim computer screens, increased font on computers, ocular rest breaks, tinted glasses as needed for screen use.  Role of the vestibular system in balance, and how it relates to impairments following concussion.  Physiological changes following concussion that could contribute to symptoms.  Purpose of PT treatment plan as it relates to relief of symptoms and promotion of maximal recovery.  Patient educated on RTP protocol and how to progress safely through stages.  Importance of avoiding head threatening activities to prevent further injury.    EVALUATION AND DIAGNOSIS:   Najah Liverman is a 26 y.o. female presenting to clinic today with resolving symptoms of concussion. Patient with improvement in neck tightness  and symptoms after manual therapy. Progressed VOR to walking and instructed patient to start walking in busy environment with VOR exercise as soon as able to perform standing without symptoms. Stress may be contributing to increase in symptoms. Patient would continue to benefit from skilled PT in order to address impairments and return to PLOF.       Recommend return to work stage: RED - per medical provider recommendation. Pt to return March 22nd.    Patient's OBJECTIVE findings today most closely align with the following clinical trajectory pattern:  1: Cervical:  Wait one week and reassess for recovery., Monitor for recovery and intervene as indicated., Educated on cervical stretches / massage., Educated on proper posture and its effect on pain / function., Consider cervical strengthening exercises.  2: Vestibular / Balance: Wait one week and reassess for recovery.  Space in motion sensitivity noted - issued HEP, see above for details.    Functional Limitations include:  Patient is unable to participate in a full work day at this time secondary to patient comfort and / or safety.  Patient is unable to participate in any physical activities at this time.     Disabilities:  Patient will be unable to fulfill work duties at prior level without recovery from injury.  Patient is unable to resume safe participation in physical activities until recovery from injury.  Patient is at risk for further / permanent impairment if not fully recovered from injury.     PLAN:  PROGRESS TOWARD DISCHARGE CRITERIA   Symptom free at rest, or at previous subjective symptom baseline. No   Symptom free with cognitive exertion.   No  Patient has not successfully completed full day of non-physical work duties without increase in symptoms.   Successfully completed at least 24 hours on the GREEN stage at school or work. No   Normalized objective findings. No  See above objective testing notes for details.   Completed the RTP-4 readiness checklist (see scanned checklist in chart) and is ready to advance to RTP-4. No, patient is not ready to advance to RTP-4.     Patient has not successfully completed all required steps above for discharge from physical therapy.    Patient does require continued skilled physical therapy intervention to progress toward above stated goals.       Recommendations:   Continue with established plan of care.    NEXT VISIT:  Re-assess impaired objective findings noted above, issue HEP as indicated.  Assess primary symptom complex and intervene as indicated.  Progress VOR.    Follow up with medical provider by 12/30/21.    Therapist Signature:    Burnard Hawthorne, DPT, PT  12/29/2021

## 2022-01-04 ENCOUNTER — Ambulatory Visit: Payer: Commercial Managed Care - POS

## 2022-01-04 DIAGNOSIS — S060X0D Concussion without loss of consciousness, subsequent encounter: Secondary | ICD-10-CM

## 2022-01-04 NOTE — Progress Notes (Signed)
[facility]  [address] [place] [address]  Phone: [phone]      Fax: [phone]    PHYSICAL THERAPY DAILY NOTE - CONCUSSION MANAGEMENT            REFERRED BY: Augustin Coupe, NP    PATIENT: Cynthia Conner DOB: [birth year]   MR #: [id number]  AGE: 26 y.o.    FACILITY PROVIDER #: [phone] PRIMARY MD: Pcp, None, MD      Date of Service PT Received On: 01/04/22   Treatment Time Start Time: 1500 to Stop Time: 1520   Time Calculation Time Calculation (min): 20 min   Visit #     Units Billed   Therapeutic Interventions  $ PT Therapeutic Activity [phone]): 1 unit     [place] referred for physical therapy services by: Augustin Coupe, NP    Certification period, precautions, medications, allergies and baseline testing status copied from initial evaluation - reviewed and reconciled today.  Kae Heller, PT 01/04/2022  Patient reports no changes to medication.    CERTIFICATION DATES:   11/18/2021 - 02/10/2022  Start of Care:  11/18/2021  Follow up with Medical Provider by 12/16/2021 if not discharged before date.  Date of Injury:   October 29 2021  ED/UCC Visit?  No ED/UCC visit reported by patient.  Imaging Performed?  No imaging noted in EMR, no imaging reported by patient.     Education and Infection Prevention Regarding Covid-19 Pandemic:  Patient was educated on [employer] and Procedure regarding COVID-19 care and safety precautions. Discussed and reviewed patient's medical history and personal risk factors and patient acknowledges exposure risk and wishes to proceed with care.     [id number] Screen: Patient does not present with a temperature >/= 100.0, no new cough, no new throat pain, and no new shortness of breath.      Personal Protective Equipment Utilized:  Procedure Mask     Medications:  currently has no medications in their medication list.     Precautions:   HISTORY OF ASTHMA?  no     IMPACT TEST ON FILE?  No Baseline testing performed.      Treatment Diagnosis:    Concussion without LOC S06.0X0.D  Cervicalgia (neck pain)  M54.2  Post Traumatic Headache G44.3  Dizziness R42      RISK FACTORS THAT MAY PREDISPOSE PATIENT TO PROLONGED RECOVERY, as well as considerations for recovery:  History of depression.  History of anxiety.  Migraines - self  ADHD  History of motion sensitivity      SHORT TERM GOALS:  To be met in 4 visits.  1.  The patient will report an understanding in proper rest and nutrition strategies to assist in recovery process.  2.  The patient will report an understanding of the pathophysiology of concussion and the goals of the recovery recommendations in an effort to support compliance and understanding.     LONG TERM GOALS:  To be met in 12 visits.  3 The patient will subjective symptom free and be able to participate in a full academic day without symptoms indicating appropriate progress toward recovery  4 The patient will be free of objective symptoms indicating a return to prior level of function.  5 The patient will be taken through the return to play (RTP) protocol successfully demonstrating recovery from injury and return to prior level of function.    *Working Toward the Above Goals: (copied from last visit)*  GOAL# Progress Toward Goals   1 Met   2 Met   3 Progressing   4 Progressing   5 Progressing     HISTORY OF PRESENT INJURY:  (Copied from initial evaluation) Patient reports her dog  head butted her. She reports an immediate goose egg. On Friday, she was hit by a ladder coming out of a truck on the side of her head. Patient reports immediate headache, nausea, sensitivity to light, anxiety/depression, and disoriented. Pt did not seek medical attention and this is her first appointment regarding concussion.     EXAMINATION:  Subjective:   Patient reports she feels 100% recovered from concussion. She reports she went running with no difficulty. She states grocery shopping is better as well. She will return to work after  spring break.     PCSS / Pain Assessment:  Score is 0 for no symptoms, and grade 1-6, with 6 being the worst.   Symptom 01/04/2022   Headache    Nausea    Vomiting    Balance problems    Dizziness    Lightheadedness    Fatigue    Trouble falling asleep    Sleeping more than usual    Sleeping less than usual    Drowsiness 1   Sensitivity to light    Sensitivity to noise    Irritability    Sadness 1   Nervous / Anxious 1   Feeling more emotional    Numbness or tingling    Feeling slowed down    Difficulty concentrating    Difficulty remembering    Visual problems    Other    Total Score: (MAX 132) 3   PCSS is increased since last visit.  Date of last symptom:  Still reporting symptoms.    Patient's SUBJECTIVE symptom report most closely aligns with the following symptom trajectory pattern:  PATIENT PRIOIRTY 1 PHYSICAL Headaches Cervical presentation, Nausea, Photophobia  PATIENT PRIORITY 2 COGNITIVE Chief cognitive complaints: Difficulty concentrating Difficulty with recall of events Fogginess Fatigue that increases as the day goes on.  Educated on the importance of cognitive breaks during the day.  Educated on the importance of no napping - support a regular sleep/wake cycle.  PATIENT PRIORITY 3 EMOTIONAL MORE EMOTIONAL - Instructed patient/family that this is a normal part of brain injury, but if it becomes severe to notify clinic, PCP or go to ED.  IRRITABILITY NOTED - Insructed patient/family that this is a normal part of brain injury and educated regarding going to the ED if safety for self or others is a concern.    Clinical Findings:  Corrective Eyewear:  No  Wearing today? N/A    Observation:  Unremarkable.   TEST RESULTS COMMENTS   CERVICAL EXAM    WNL WNL Prev Visit Imp NT    Cervical AROM []  [x]  []  []     Cervical Isometrics []  [x]  []  []     Palpation [x]  []  []  []     Sharps Purser []  [x]  []  []     Alar Ligament []  [x]  []  []     Cervical Kines. []  []  []  [x]     Cervical PROM []  [x]  []  []     Upper C rot'n []  [x]  []   []     Deep C flexor strength    x      SIGHT AND FUNCTIONAL VISION    WNL WN PrevVisit Imp NT    Smooth Pursuits  Saccadic? []  [x]  []  []     EOM / ROM []  [x]  []  []   Saccades  H  Hypermetric  hypometric []  [x]  []  []     Saccades  V  Hypermetric  Hypometric []  [x]  []  []     Acuity B []  [x]  []  []     Acuity R []  [x]  []  []     Acuity L    []  [x]  []  []     NPC (10"')  Insufficiency? []  [x]  []  []     Accomm  R []  [x]  []  []     Accomm  L    []  [x]  []  []     Cover / Uncover  Phorias?  Eso / Exo?      []  [x]  []  []      []  []  []  []        []  []  []  []       VISUAL / VESTIBULAR    WNL WNL Prev Visit Imp NT    VOR - H [x]  []  []  []      VOR - V []  [x]  []  []     VOR-C []  [x]  []  []     VOG []  []  []  [x]      []  []  []  []      []  []  []  []        BALANCE    WNL WNL Prev Visit Imp NT    MCTSIB - 1 []  [x]  []  []     MCTSIB - 2  []  [x]  []  []     MCTSIB - 3 []  [x]  []  []     MCTSIB - 4 []  [x]  []  []     Foam, EO with head turns []  []  []  [x]     Foam, EC with head turns []  []  []  [x]     Tandem Gait []  [x]  []  []     Tandem w/ Cog []  [x]  []  []     Gait - H turns []  [x]  []  []     Gait - V turns []  [x]  []  []     Neurocom SOT []  [x]  []  []      []  []  []  []       PHYSIOLOGICAL PERFORMANCE   Supine  Positional Testing Reveals:  Not tested today.   Standing x1'     Standing x2'     [place] Concussion TT WNL  [x]       Impaired  []     NT  []         IMPACT TESTING    WNL WNL Prev Visit Imp NT Impact Test administered to assess for symptom provocation with cognitive exertion.   ImPact Post- Injury Test []  []  []  [x]     NT = not indicated for assessment, or not tested due to time constraints.      [place] CONCUSSION TREADMILL TEST PERFORMED to assess for appropriate physiological response and symptom provocation with physical exertion.    RISK FACTORS FOR EXERCISE:  History of Asthma?  no    STANDING Resting Blood Pressure:  124/72 HR:  79    Contraindications to exercise on same day - NOTIFY PROVIDER IF:  Resting Diastolic BP >110 or Systolic BP >200 - ABSOLUTE - DO NOT  EXERCISE  Resting Diastolic BP >90 or Systolic BP >140 - RELATIVE - NEEDS MD CLEARANCE    Patient does not have contraindications to exercise today.    [place] Concussion Treadmill Test performed for assessment of physiological performance and symptom provocation with physical exertion.    80% MHR Based on Karvonen Formula = 170  Karvonen Formula = (220-age, minus RHR, times .8, plus RHR)  Minute % Grade Speed RPE   (6-20) HR Symptoms?  HA  1/10 to start   1 0 3.3 mph 7 105    2 0 3.3 mph 8 109    3 1 3.3 mph 8 114    4 2 3.3 mph 10 117    5 3 3.3 mph 11 122    6 4 3.3 mph 12 125    7 5 3.3 mph 12 127    8 6 3.3 mph 13 134    9 7 3.3 mph 14 139    10 8 3.3 mph 14 142    11 9 3.3 mph 15 152    12 10 3.3 mph 15 154    13 11 3.3 mph 15 163    14 12 3.3 mph 15 168    15 13 3.3 mph 15 174      *If patient is an athlete and has not reached 80% of MHR by 15% incline, increase pace by 0.72mph every minute until 80% MHR is achieved.    Cool down was performed.    Post-Exercise - STANDING BP HR   3 minute/s 131/81 120     Patient DID pass the treadmill test - no significant change in subjective symptom report and physiological response is within normal ranges.    ASSESSMENTS / INTERVENTION:   THERAPEUTIC ACTIVITY Re-assessment of objective impairments noted last visit performed - see above for details.      EVALUATION AND DIAGNOSIS:   Camile Esters is a 26 y.o. female presenting to clinic today with resolving symptoms of concussion. Patient with no symptoms and all objective findings WNL.    Recommend return to work stage: GREEN. 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 Yes   Symptom free with cognitive exertion.   Yes  Patient has  successfully completed full day of non-physical work duties without increase in symptoms.   Successfully completed at least 24 hours on the GREEN stage at school or work. Yes   Normalized objective findings. Yes  See above objective testing notes for details.   Completed the RTP-4 readiness checklist (see scanned checklist in chart) and is ready to advance to RTP-4. NA     Patient has  successfully completed all required steps above for discharge from physical therapy.    Patient does not require continued skilled physical therapy intervention to progress toward above stated goals.      Recommendations:   Discharge PT      Therapist Signature:    Burnard Hawthorne, DPT, PT  01/04/2022
